# Patient Record
Sex: Female | Born: 1995 | Race: Black or African American | Hispanic: No | Marital: Married | Smoking: Former smoker
Health system: Southern US, Community
[De-identification: ages and names within clinical notes are randomized; demographics above are authoritative.]

## PROBLEM LIST (undated history)

## (undated) DIAGNOSIS — R519 Headache, unspecified: Secondary | ICD-10-CM

## (undated) DIAGNOSIS — Z789 Other specified health status: Secondary | ICD-10-CM

## (undated) HISTORY — PX: NO PAST SURGERIES: SHX2092

## (undated) HISTORY — DX: Other specified health status: Z78.9

---

## 2020-05-19 ENCOUNTER — Ambulatory Visit (INDEPENDENT_AMBULATORY_CARE_PROVIDER_SITE_OTHER): Payer: 59 | Admitting: Obstetrics and Gynecology

## 2020-05-19 ENCOUNTER — Encounter: Payer: Self-pay | Admitting: Obstetrics and Gynecology

## 2020-05-19 ENCOUNTER — Other Ambulatory Visit: Payer: Self-pay

## 2020-05-19 VITALS — BP 120/84 | HR 97 | Ht 66.0 in | Wt 141.0 lb

## 2020-05-19 DIAGNOSIS — N926 Irregular menstruation, unspecified: Secondary | ICD-10-CM | POA: Diagnosis not present

## 2020-05-19 NOTE — Progress Notes (Signed)
Gynecology H&P  PCP: System, Pcp Not In  Chief Complaint:  Chief Complaint  Patient presents with  . Advice Only    Infertility    History of Present Illness: Patient is a 24 y.o. G1P0010 presenting for evaluation of infertility. Patient and partner have been attempting unprotected intercourse and actively trying for conception for 1 years.  Pregnancies with current partner no  Sexual History Frequency: some impotence issues urology evaluation pending Dyspareunia: no Use of Lubricant: no Douching: no   Ovulatory Evaluation LMP: Patient's last menstrual period was 04/30/2020. Menarche:11 Interval irregular occasional skips a month Duration of flow: 305 days Heavy Menses: no Clots: no Intermenstrual Bleeding: no Dysmenorrhea: no  Molim[place] occasionally Ovulation Predictor Kits Positive  not previously checked  PCOS  Wt Change: no Hirsutism: no Acne: yes  Hyperprolactinemia Galactorrhea: no Headaches: yes Vision Changes:  no  Thyroid Temperature Intolerance: yes Constipation or Diarrhea: yes constipation Hair Thinning:  no Palpitation:  no  Prior treatments Meds: none Other Therapies: Not applicable  Premature Ovarian Failure Family history of autism, mental retardation, or fragile X: no Family history of premature ovarian failure or early menopause: no Prior radiation or chemotherapy exposoure:  no  Tubal Factor Previous abdominal or pelvic surgery: no Pelvic Pain:  no Endometriosis: no STD: no PID: no Laparoscopy: no Prior HSG: no  Female Factor Sired prior conception:  no Semen analysis: no  Review of Systems: 10 point review of systems negative unless otherwise noted in HPI  Past Medical History:  There are no problems to display for this patient.   Past Surgical History:  Past Surgical History:  Procedure Laterality Date  . NO PAST SURGERIES      Family History:  History reviewed. No pertinent family history. Thyroid Problems:  yes Birth Defects/Inherited diseases:no  Social History:  Social History   Socioeconomic History  . Marital status: Married    Spouse name: Not on file  . Number of children: Not on file  . Years of education: Not on file  . Highest education level: Not on file  Occupational History  . Not on file  Tobacco Use  . Smoking status: Current Every Day Smoker  . Smokeless tobacco: Never Used  Vaping Use  . Vaping Use: Never used  Substance and Sexual Activity  . Alcohol use: Not Currently  . Drug use: Yes  . Sexual activity: Yes    Partners: Male    Birth control/protection: None  Other Topics Concern  . Not on file  Social History Narrative  . Not on file   Social Determinants of Health   Financial Resource Strain:   . Difficulty of Paying Living Expenses:   Food Insecurity:   . Worried About Programme researcher, broadcasting/film/video in the Last Year:   . Barista in the Last Year:   Transportation Needs:   . Freight forwarder (Medical):   Marland Kitchen Lack of Transportation (Non-Medical):   Physical Activity:   . Days of Exercise per Week:   . Minutes of Exercise per Session:   Stress:   . Feeling of Stress :   Social Connections:   . Frequency of Communication with Friends and Family:   . Frequency of Social Gatherings with Friends and Family:   . Attends Religious Services:   . Active Member of Clubs or Organizations:   . Attends Banker Meetings:   Marland Kitchen Marital Status:   Intimate Partner Violence:   . Fear of Current or  Ex-Partner:   . Emotionally Abused:   Marland Kitchen Physically Abused:   . Sexually Abused:     Allergies:  Allergies  Allergen Reactions  . Latex     Medications: Prior to Admission medications   Not on File    Physical Exam Vitals: Blood pressure 120/84, pulse 97, height 5\' 6"  (1.676 m), weight 141 lb (64 kg), last menstrual period 04/30/2020.  General: NAD HEENT: normocephalic, anicteric Pulmonary: No increased work of breathing Extremities: no  edema, erythema, or tenderness Neurologic: Grossly intact Psychiatric: mood appropriate, affect full  * No order type specified *  Assessment: 24 y.o. G1P0010 presenting for initial infertility evaluatoin Plan: 1) We discussed the underlying etiologies which may be implicated in a couple experiencing difficulty conceiving.  The average couple will conceive within the span of 1 year with unprotected coitus, with a monthly fecundity rate of 20% or 1 in 5.  Even without further work up or intervention the patient and her partner may be successful in conceiving unassisted, although if an underlying etiology can be identified and addressed fecundity rate may improve.  The work up entails examining for ovulatory function, tubal patency, and ruling out female factor infertility.  These may be looked at concurrently or sequentially.  The downside of sequential work up is that this method may miss issues if more than one compartment is contributing.  She is aware that tubal factor or moderate to severe female factor infertility will require further consultation with a reproductive endocrinologist.  In the case of anovulation, use of Clomid (clomiphen citrate) or Femara (letrazole) were discussed with the understanding the the later is an off-label, but well supported use.  With either of these drugs the risk of multiples increases from the standard population rate of 2% to approximately 10%, with higher order multiples possible but unlikely.  Both drugs may require some time to titrate to the appropriate dosage to ensure consistent ovulation.  Cycles will be limited to 6 cycles on each drug secondary to decreasing rates of conception after 6 cycles.  In addition should patient be started on ovulation induction with Clomid she was advised to discontinue the drug for any vision changes as this is a rare but potentially permanent side-effect if medication is continued.  We discussed timing of intercourse as well as the use  of ovulation predictor kits identify the patient's fertile window each month.     2) Preconception counseling - immunization up to date.  The patient denies any family history of conditions which would warrant preconception genetic counseling or testing on her or her partner.  Instructed to start prenatal vitamins while trying to conceive.    3) Return will call patient with results.    24, MD, [facility], [facility] 05/19/2020, 3:15 PM

## 2020-05-22 LAB — TSH+PRL+FSH+TESTT+LH+DHEA S...
17-Hydroxyprogesterone: 248 ng/dL
Androstenedione: 152 ng/dL (ref 41–262)
DHEA-SO4: 167 ug/dL (ref 110.0–431.7)
FSH: 14 m[IU]/mL
LH: 80.7 m[IU]/mL
Prolactin: 22 ng/mL (ref 4.8–23.3)
TSH: 0.775 u[IU]/mL (ref 0.450–4.500)
Testosterone, Free: 1.8 pg/mL (ref 0.0–4.2)
Testosterone: 25 ng/dL (ref 13–71)

## 2020-05-22 LAB — PROGESTERONE: Progesterone: 0.7 ng/mL

## 2020-05-22 LAB — ESTRADIOL: Estradiol: 228 pg/mL

## 2020-09-24 ENCOUNTER — Encounter: Payer: Self-pay | Admitting: *Deleted

## 2020-09-24 ENCOUNTER — Emergency Department
Admission: EM | Admit: 2020-09-24 | Discharge: 2020-09-24 | Disposition: A | Discharge status: Home / Self Care | Payer: Self-pay | Attending: Emergency Medicine | Admitting: Emergency Medicine

## 2020-09-24 ENCOUNTER — Other Ambulatory Visit: Payer: Self-pay

## 2020-09-24 DIAGNOSIS — M545 Low back pain, unspecified: Secondary | ICD-10-CM | POA: Insufficient documentation

## 2020-09-24 DIAGNOSIS — F17200 Nicotine dependence, unspecified, uncomplicated: Secondary | ICD-10-CM | POA: Insufficient documentation

## 2020-09-24 DIAGNOSIS — Z91040 Latex allergy status: Secondary | ICD-10-CM | POA: Insufficient documentation

## 2020-09-24 DIAGNOSIS — M25552 Pain in left hip: Secondary | ICD-10-CM | POA: Insufficient documentation

## 2020-09-24 LAB — PREGNANCY, URINE: Preg Test, Ur: NEGATIVE

## 2020-09-24 LAB — URINALYSIS, COMPLETE (UACMP) WITH MICROSCOPIC
Bacteria, UA: NONE SEEN
Bilirubin Urine: NEGATIVE
Glucose, UA: NEGATIVE mg/dL
Hgb urine dipstick: NEGATIVE
Ketones, ur: 20 mg/dL — AB
Leukocytes,Ua: NEGATIVE
Nitrite: NEGATIVE
Protein, ur: 100 mg/dL — AB
Specific Gravity, Urine: 1.018 (ref 1.005–1.030)
pH: 6 (ref 5.0–8.0)

## 2020-09-24 MED ORDER — CYCLOBENZAPRINE HCL 5 MG PO TABS
5.0000 mg | ORAL_TABLET | Freq: Three times a day (TID) | ORAL | 0 refills | Status: DC | PRN
Start: 2020-09-24 — End: 2020-11-26

## 2020-09-24 MED ORDER — CYCLOBENZAPRINE HCL 10 MG PO TABS
10.0000 mg | ORAL_TABLET | Freq: Once | ORAL | Status: AC
  Administered 2020-09-24: 10 mg via ORAL
  Filled 2020-09-24: qty 1

## 2020-09-24 MED ORDER — KETOROLAC TROMETHAMINE 10 MG PO TABS
10.0000 mg | ORAL_TABLET | Freq: Three times a day (TID) | ORAL | 0 refills | Status: DC
Start: 2020-09-24 — End: 2020-11-26

## 2020-09-24 MED ORDER — KETOROLAC TROMETHAMINE 30 MG/ML IJ SOLN
30.0000 mg | Freq: Once | INTRAMUSCULAR | Status: AC
  Administered 2020-09-24: 30 mg via INTRAMUSCULAR
  Filled 2020-09-24: qty 1

## 2020-09-24 NOTE — ED Notes (Addendum)
Pt also stating she feels lately that she is urinating more frequently and having to think about emptying her bladder. No dysuria reported. Pt unable to urinate at this time but given a sample cup and water.

## 2020-09-24 NOTE — ED Triage Notes (Signed)
Pt to ED reporting left sided hip pain radiating to her leg and up her back. No recent injury. Pt works with [employer] and reports having to move frequently at work.

## 2020-09-24 NOTE — ED Notes (Signed)
Pt states that last week she began to have lower back and left hip pain. Pt reports pain worsened yesterday and became constant. Some relief with laying down/feet elevated.   No specific instances of strain/injury noted. Not currently taking any pain medication. [employer] delivery driver  Pt reports urinary frequency and urgency, denies pain. "even drinking just a little bit, I feel like I have to pee immediately. And it's never a lot." Previously had UTI's

## 2020-09-24 NOTE — Discharge Instructions (Signed)
Take the prescription medications as provided.  Follow-up with primary provider for ongoing symptoms.  Return to the [facility] urgent care if symptoms persist.

## 2020-09-25 NOTE — ED Provider Notes (Signed)
[facility] Emergency Department Provider Note ____________________________________________  Time seen: 2048  I have reviewed the triage vital signs and the nursing notes.  HISTORY  Chief Complaint  Hip Pain  HPI Kendra Ward is a 24 y.o. female presents with self to the ED for evaluation of left hip and low back pain without recent injury.  Patient reports symptom has been present for about a week.  She has been taking Excedrin with some intermittent benefit.  She denies any preceding injury, trauma, or fall patient denies any bladder or bowel incontinence, foot drop, or saddle anesthesia.  She denies any history of chronic ongoing back or hip problems.  She denies any dysuria, hematuria, or urinary retention.   History reviewed. No pertinent past medical history.  There are no problems to display for this patient.   Past Surgical History:  Procedure Laterality Date  . NO PAST SURGERIES      Prior to Admission medications   Medication Sig Start Date End Date Taking? Authorizing Provider  cyclobenzaprine (FLEXERIL) 5 MG tablet Take 1 tablet (5 mg total) by mouth 3 (three) times daily as needed. 09/24/20   Ziyonna Christner, Charlesetta Ivory, PA-C  ketorolac (TORADOL) 10 MG tablet Take 1 tablet (10 mg total) by mouth every 8 (eight) hours. 09/24/20   Raniyah Curenton, Charlesetta Ivory, PA-C    Allergies Latex  History reviewed. No pertinent family history.  Social History Social History   Tobacco Use  . Smoking status: Current Every Day Smoker  . Smokeless tobacco: Never Used  Vaping Use  . Vaping Use: Never used  Substance Use Topics  . Alcohol use: Not Currently  . Drug use: Yes    Types: Marijuana    Review of Systems  Constitutional: Negative for fever. Cardiovascular: Negative for chest pain. Respiratory: Negative for shortness of breath. Gastrointestinal: Negative for abdominal pain, vomiting and diarrhea. Genitourinary: Negative for  dysuria. Musculoskeletal: Positive for low back and left hip pain. Skin: Negative for rash. Neurological: Negative for headaches, focal weakness or numbness. ____________________________________________  PHYSICAL EXAM:  VITAL SIGNS: ED Triage Vitals  Enc Vitals Group     BP 09/24/20 1958 134/74     Pulse Rate 09/24/20 1958 78     Resp 09/24/20 1958 16     Temp 09/24/20 1958 98.2 F (36.8 C)     Temp Source 09/24/20 1958 Oral     SpO2 09/24/20 1958 98 %     Weight --      Height --      Head Circumference --      Peak Flow --      Pain Score 09/24/20 1959 8     Pain Loc --      Pain Edu? --      Excl. in GC? --     Constitutional: Alert and oriented. Well appearing and in no distress. Head: Normocephalic and atraumatic. Eyes: Conjunctivae are normal. Normal extraocular movements Cardiovascular: Normal rate, regular rhythm. Normal distal pulses. Respiratory: Normal respiratory effort. No wheezes/rales/rhonchi. Gastrointestinal: Soft and nontender. No distention. No CVA tenderness Musculoskeletal: Normal spinal alignment without midline tenderness, spasm, vomiting, or step-off.  Normal hip flexion extension range on exam.  Mild tender to palpation over the lateral hip pointer.  Nontender with normal range of motion in all extremities.  Neurologic: Cranial nerves II to XII grossly intact.  Normal LE DTRs bilaterally.  Negative supine straight leg raise.  Normal gait without ataxia. Normal speech and language. No gross focal  neurologic deficits are appreciated. Skin:  Skin is warm, dry and intact. No rash noted. Psychiatric: Mood and affect are normal. Patient exhibits appropriate insight and judgment. ____________________________________________   LABS (pertinent positives/negatives) Labs Reviewed  URINALYSIS, COMPLETE (UACMP) WITH MICROSCOPIC - Abnormal; Notable for the following components:      Result Value   Color, Urine YELLOW (*)    APPearance CLEAR (*)    Ketones, ur  20 (*)    Protein, ur 100 (*)    All other components within normal limits  PREGNANCY, URINE  ___________________________________________  PROCEDURES  Toradol 30 mg IM Flexeril 10 mg PO  Procedures ____________________________________________  INITIAL IMPRESSION / ASSESSMENT AND PLAN / ED COURSE  DDX: lumbago, trochanteric bursitis, ITB syndrome, UTI/pyelonephritis  Patient with ED evaluation of left-sided hip pain and some low back pain.  Patient exam is without any signs of acute neuromuscular deficit or any red flags on exam.  She was treated for symptoms that likely indicate musculoskeletal strain or tendinitis.  Prescription for ketorolac and cyclobenzaprine provided for benefit.  She is referred to her primary provider for ongoing symptom management.  Kendra Ward was evaluated in Emergency Department on 09/25/2020 for the symptoms described in the history of present illness. She was evaluated in the context of the global COVID-19 pandemic, which necessitated consideration that the patient might be at risk for infection with the SARS-CoV-2 virus that causes COVID-19. Institutional protocols and algorithms that pertain to the evaluation of patients at risk for COVID-19 are in a state of rapid change based on information released by regulatory bodies including the CDC and federal and state organizations. These policies and algorithms were followed during the patient's care in the ED. ___________________________________________  FINAL CLINICAL IMPRESSION(S) / ED DIAGNOSES  Final diagnoses:  Acute left-sided low back pain without sciatica  Acute pain of left hip      Karmen Stabs, Charlesetta Ivory, PA-C 09/25/20 0027    Arnaldo Natal, MD 09/29/20 1221

## 2020-09-30 ENCOUNTER — Telehealth: Payer: Self-pay | Admitting: Obstetrics and Gynecology

## 2020-09-30 NOTE — Telephone Encounter (Signed)
Patient is calling requesting  Call back with her blood work results from her apt on 05/19/20. Please Advise.

## 2020-10-01 ENCOUNTER — Other Ambulatory Visit: Payer: Self-pay | Admitting: Obstetrics and Gynecology

## 2020-10-01 DIAGNOSIS — N970 Female infertility associated with anovulation: Secondary | ICD-10-CM

## 2020-10-01 MED ORDER — LETROZOLE 2.5 MG PO TABS: 2.5000 mg | ORAL_TABLET | Freq: Every day | ORAL | 0 refills | Status: AC

## 2020-10-01 NOTE — Telephone Encounter (Signed)
Day 21 progesterone on 10/22/2019 order should be in

## 2020-10-01 NOTE — Progress Notes (Signed)
Cycle I letrozole 2.5mg  LMP 10/01/2020 and day 21 progesterone 10/22/2019

## 2020-10-06 NOTE — Telephone Encounter (Signed)
Patient is scheduled for 10/21/20 at 10 am for labs

## 2020-10-11 DIAGNOSIS — N83209 Unspecified ovarian cyst, unspecified side: Secondary | ICD-10-CM

## 2020-10-11 HISTORY — DX: Unspecified ovarian cyst, unspecified side: N83.209

## 2020-10-21 ENCOUNTER — Other Ambulatory Visit: Payer: Self-pay

## 2020-10-22 LAB — PROGESTERONE: Progesterone: 25.3 ng/mL

## 2020-10-31 ENCOUNTER — Other Ambulatory Visit: Payer: Self-pay | Admitting: Obstetrics and Gynecology

## 2020-10-31 MED ORDER — LETROZOLE 2.5 MG PO TABS: 2.5000 mg | ORAL_TABLET | Freq: Every day | ORAL | 0 refills | Status: AC

## 2020-10-31 NOTE — Progress Notes (Signed)
Letrozole cycle II LMP 10/30/2019

## 2020-11-25 ENCOUNTER — Encounter: Payer: Self-pay | Admitting: Emergency Medicine

## 2020-11-25 ENCOUNTER — Emergency Department: Payer: Medicaid Other

## 2020-11-25 ENCOUNTER — Other Ambulatory Visit: Payer: Self-pay

## 2020-11-25 ENCOUNTER — Observation Stay
Admission: EM | Admit: 2020-11-25 | Discharge: 2020-11-26 | Disposition: A | Discharge status: Home / Self Care | Payer: Medicaid Other | Attending: Emergency Medicine | Admitting: Emergency Medicine

## 2020-11-25 DIAGNOSIS — Z91040 Latex allergy status: Secondary | ICD-10-CM | POA: Diagnosis not present

## 2020-11-25 DIAGNOSIS — R1032 Left lower quadrant pain: Secondary | ICD-10-CM

## 2020-11-25 DIAGNOSIS — O3481 Maternal care for other abnormalities of pelvic organs, first trimester: Principal | ICD-10-CM | POA: Insufficient documentation

## 2020-11-25 DIAGNOSIS — N83202 Unspecified ovarian cyst, left side: Secondary | ICD-10-CM | POA: Insufficient documentation

## 2020-11-25 DIAGNOSIS — Z20822 Contact with and (suspected) exposure to covid-19: Secondary | ICD-10-CM | POA: Insufficient documentation

## 2020-11-25 DIAGNOSIS — O99331 Smoking (tobacco) complicating pregnancy, first trimester: Secondary | ICD-10-CM | POA: Diagnosis not present

## 2020-11-25 DIAGNOSIS — F17200 Nicotine dependence, unspecified, uncomplicated: Secondary | ICD-10-CM | POA: Insufficient documentation

## 2020-11-25 DIAGNOSIS — R1030 Lower abdominal pain, unspecified: Secondary | ICD-10-CM

## 2020-11-25 DIAGNOSIS — O009 Unspecified ectopic pregnancy without intrauterine pregnancy: Secondary | ICD-10-CM

## 2020-11-25 DIAGNOSIS — Z3A01 Less than 8 weeks gestation of pregnancy: Secondary | ICD-10-CM | POA: Diagnosis not present

## 2020-11-25 DIAGNOSIS — O26891 Other specified pregnancy related conditions, first trimester: Secondary | ICD-10-CM | POA: Diagnosis present

## 2020-11-25 LAB — WET PREP, GENITAL
Sperm: NONE SEEN
Trich, Wet Prep: NONE SEEN
Yeast Wet Prep HPF POC: NONE SEEN

## 2020-11-25 LAB — COMPREHENSIVE METABOLIC PANEL
ALT: 15 U/L (ref 0–44)
AST: 21 U/L (ref 15–41)
Albumin: 4.4 g/dL (ref 3.5–5.0)
Alkaline Phosphatase: 40 U/L (ref 38–126)
Anion gap: 7 (ref 5–15)
BUN: 11 mg/dL (ref 6–20)
CO2: 23 mmol/L (ref 22–32)
Calcium: 9.2 mg/dL (ref 8.9–10.3)
Chloride: 105 mmol/L (ref 98–111)
Creatinine, Ser: 0.65 mg/dL (ref 0.44–1.00)
GFR, Estimated: >60 mL/min (ref >60)
Glucose, Bld: 105 mg/dL — ABNORMAL HIGH (ref 70–99)
Potassium: 3.3 mmol/L — ABNORMAL LOW (ref 3.5–5.1)
Sodium: 135 mmol/L (ref 135–145)
Total Bilirubin: 0.8 mg/dL (ref 0.3–1.2)
Total Protein: 7.9 g/dL (ref 6.5–8.1)

## 2020-11-25 LAB — URINALYSIS, COMPLETE (UACMP) WITH MICROSCOPIC
Bacteria, UA: NONE SEEN
Bilirubin Urine: NEGATIVE
Glucose, UA: NEGATIVE mg/dL
Hgb urine dipstick: NEGATIVE
Ketones, ur: 5 mg/dL — AB
Leukocytes,Ua: NEGATIVE
Nitrite: NEGATIVE
Protein, ur: 30 mg/dL — AB
Specific Gravity, Urine: 1.027 (ref 1.005–1.030)
pH: 5 (ref 5.0–8.0)

## 2020-11-25 LAB — CBC
HCT: 34.6 % — ABNORMAL LOW (ref 36.0–46.0)
Hemoglobin: 12.3 g/dL (ref 12.0–15.0)
MCH: 31.4 pg (ref 26.0–34.0)
MCHC: 35.5 g/dL (ref 30.0–36.0)
MCV: 88.3 fL (ref 80.0–100.0)
Platelets: 313 10*3/uL (ref 150–400)
RBC: 3.92 MIL/uL (ref 3.87–5.11)
RDW: 12.1 % (ref 11.5–15.5)
WBC: 7.7 10*3/uL (ref 4.0–10.5)
nRBC: 0 % (ref 0.0–0.2)

## 2020-11-25 LAB — LIPASE, BLOOD: Lipase: 23 U/L (ref 11–51)

## 2020-11-25 LAB — HCG, QUANTITATIVE, PREGNANCY: hCG, Beta Chain, Quant, S: 479 m[IU]/mL — ABNORMAL HIGH (ref <5)

## 2020-11-25 MED ORDER — METRONIDAZOLE 500 MG PO TABS: 500.0000 mg | ORAL_TABLET | Freq: Two times a day (BID) | ORAL | 0 refills | Status: AC

## 2020-11-25 MED ORDER — POTASSIUM CHLORIDE CRYS ER 20 MEQ PO TBCR
20.0000 meq | EXTENDED_RELEASE_TABLET | Freq: Once | ORAL | Status: AC
  Administered 2020-11-25: 20 meq via ORAL
  Filled 2020-11-25: qty 1

## 2020-11-25 MED ORDER — ONDANSETRON 4 MG PO TBDP
4.0000 mg | ORAL_TABLET | Freq: Once | ORAL | Status: AC
  Administered 2020-11-25: 4 mg via ORAL
  Filled 2020-11-25: qty 1

## 2020-11-25 MED ORDER — ONDANSETRON 4 MG PO TBDP: 4.0000 mg | ORAL_TABLET | Freq: Three times a day (TID) | ORAL | 0 refills | Status: DC | PRN

## 2020-11-25 MED ORDER — METRONIDAZOLE 500 MG PO TABS: 500.0000 mg | ORAL_TABLET | Freq: Once | ORAL | Status: DC

## 2020-11-25 MED ORDER — SODIUM CHLORIDE 0.9 % IV SOLN: INTRAVENOUS | Status: DC

## 2020-11-25 MED ORDER — ACETAMINOPHEN 500 MG PO TABS
1000.0000 mg | ORAL_TABLET | Freq: Once | ORAL | Status: AC
  Administered 2020-11-25: 1000 mg via ORAL
  Filled 2020-11-25: qty 2

## 2020-11-25 NOTE — ED Provider Notes (Signed)
[facility] Emergency Department Provider Note   ____________________________________________   Event Date/Time   First MD Initiated Contact with Patient 11/25/20 2117     (approximate)  I have reviewed the triage vital signs and the nursing notes.   HISTORY  Chief Complaint Abdominal Pain    HPI Kendra Ward is a 25 y.o. female with no significant past medical history who presents to the ED complaining of abdominal pain.  Patient reports she has been having intermittent crampy lower abdominal pain for the past 3 days.  There is been associated with nausea and a couple episodes of vomiting, but she denies any diarrhea.  She took a pregnancy test earlier today which was positive and decided to seek further evaluation in the ED.  She denies any fevers, dysuria, hematuria, vaginal bleeding, or vaginal discharge.  She has had 1 miscarriage previously but does not have any children.  She has not taken anything for her pain at home.        History reviewed. No pertinent past medical history.  There are no problems to display for this patient.   Past Surgical History:  Procedure Laterality Date  . NO PAST SURGERIES      Prior to Admission medications   Medication Sig Start Date End Date Taking? Authorizing Provider  cyclobenzaprine (FLEXERIL) 5 MG tablet Take 1 tablet (5 mg total) by mouth 3 (three) times daily as needed. 09/24/20   Menshew, Charlesetta Ivory, PA-C  ketorolac (TORADOL) 10 MG tablet Take 1 tablet (10 mg total) by mouth every 8 (eight) hours. 09/24/20   Menshew, Charlesetta Ivory, PA-C    Allergies Latex  History reviewed. No pertinent family history.  Social History Social History   Tobacco Use  . Smoking status: Current Every Day Smoker  . Smokeless tobacco: Never Used  Vaping Use  . Vaping Use: Never used  Substance Use Topics  . Alcohol use: Not Currently  . Drug use: Yes    Types: Marijuana    Review of  Systems  Constitutional: No fever/chills Eyes: No visual changes. ENT: No sore throat. Cardiovascular: Denies chest pain. Respiratory: Denies shortness of breath. Gastrointestinal: Positive for abdominal pain, nausea, and vomiting.  No diarrhea.  No constipation. Genitourinary: Negative for dysuria. Musculoskeletal: Negative for back pain. Skin: Negative for rash. Neurological: Negative for headaches, focal weakness or numbness.  ____________________________________________   PHYSICAL EXAM:  VITAL SIGNS: ED Triage Vitals [11/25/20 2014]  Enc Vitals Group     BP 126/77     Pulse Rate 91     Resp 17     Temp 98.5 F (36.9 C)     Temp Source Oral     SpO2 100 %     Weight 145 lb (65.8 kg)     Height      Head Circumference      Peak Flow      Pain Score      Pain Loc      Pain Edu?      Excl. in GC?     Constitutional: Alert and oriented. Eyes: Conjunctivae are normal. Head: Atraumatic. Nose: No congestion/rhinnorhea. Mouth/Throat: Mucous membranes are moist. Neck: Normal ROM Cardiovascular: Normal rate, regular rhythm. Grossly normal heart sounds. Respiratory: Normal respiratory effort.  No retractions. Lungs CTAB. Gastrointestinal: Soft and nontender. No distention. Genitourinary: Cervical os closed, no bleeding noted, no cervical or adnexal tenderness. Musculoskeletal: No lower extremity tenderness nor edema. Neurologic:  Normal speech and language. No gross  focal neurologic deficits are appreciated. Skin:  Skin is warm, dry and intact. No rash noted. Psychiatric: Mood and affect are normal. Speech and behavior are normal.  ____________________________________________   LABS (all labs ordered are listed, but only abnormal results are displayed)  Labs Reviewed  COMPREHENSIVE METABOLIC PANEL - Abnormal; Notable for the following components:      Result Value   Potassium 3.3 (*)    Glucose, Bld 105 (*)    All other components within normal limits  CBC -  Abnormal; Notable for the following components:   HCT 34.6 (*)    All other components within normal limits  URINALYSIS, COMPLETE (UACMP) WITH MICROSCOPIC - Abnormal; Notable for the following components:   Color, Urine YELLOW (*)    APPearance HAZY (*)    Ketones, ur 5 (*)    Protein, ur 30 (*)    All other components within normal limits  HCG, QUANTITATIVE, PREGNANCY - Abnormal; Notable for the following components:   hCG, Beta Chain, Quant, S 479 (*)    All other components within normal limits  WET PREP, GENITAL  CHLAMYDIA/NGC RT PCR ([facility] ONLY)  LIPASE, BLOOD    PROCEDURES  Procedure(s) performed (including Critical Care):  Procedures   ____________________________________________   INITIAL IMPRESSION / ASSESSMENT AND PLAN / ED COURSE       25 year old female with no significant past medical history who presents to the ED complaining of abdominal pain for about the past 3 days with nausea and occasional vomiting.  She has no focal tenderness on exam and pelvic exam is reassuring with no cervical motion adnexal tenderness.  Lab work is unremarkable, beta hCG is quite low around 450.  We will further assess with ultrasound for location of pregnancy, but it appears to be very early in patient's pregnancy given her LMP of about 4 weeks ago.  UA shows no evidence of infection and wet prep is pending.  Patient turned over to oncoming provider pending results of ultrasound.  If ultrasound is unable to locate pregnancy, patient be appropriate for discharge home with close OB/GYN follow-up.      ____________________________________________   FINAL CLINICAL IMPRESSION(S) / ED DIAGNOSES  Final diagnoses:  Abdominal pain during pregnancy in first trimester     ED Discharge Orders    None       Note:  This document was prepared using Dragon voice recognition software and may include unintentional dictation errors.   Chesley Noon, MD 11/25/20 2312

## 2020-11-25 NOTE — ED Provider Notes (Addendum)
11:00 PM  Assumed care.  Patient is a 25 y.o. F who presents to ED with 2 days pelvic pain, N/V.  Is pregnant with HCG less than 500. [place] pending.   Will follow up with [facility].  No bleeding, pelvic unremarkable.  Wet prep pending.  11:37 PM  D/w radiology.  Patient appears to have a ruptured left-sided ectopic pregnancy.  LMP Jan 19th.  She appears comfortable at this time and denies any pain.  Hemodynamically stable.  Hemoglobin normal.  No vaginal bleeding.  Discussed with Dr. Tiburcio Pea with [facility].  He will see patient in the ED.  We will keep patient n.p.o. and start IV fluids and obtain IV access.  Patient and partner have been updated with these results.  She reports that she is on letrozole currently.  She has been pregnant once before with history of spontaneous miscarriage.  No h/o ectopic previously.  12:00 AM  Pt has been seen by Dr. Tiburcio Pea.  He will plan to take patient to the operating room.  States given her low hCG and recent menstrual cycle January 19 but this also could be a ruptured cyst but will perform laparoscopy for further evaluation.  Appreciate OB/GYN help.  Patient continues to be hemodynamically stable.  I reviewed all nursing notes and pertinent previous records as available.  I have reviewed and interpreted any EKGs, lab and urine results, imaging (as available).    CRITICAL CARE Performed by: Rochele Raring   Total critical care time: 45 minutes  Critical care time was exclusive of separately billable procedures and treating other patients.  Critical care was necessary to treat or prevent imminent or life-threatening deterioration.  Critical care was time spent personally by me on the following activities: development of treatment plan with patient and/or surrogate as well as nursing, discussions with consultants, evaluation of patient's response to treatment, examination of patient, obtaining history from patient or surrogate, ordering and performing treatments  and interventions, ordering and review of laboratory studies, ordering and review of radiographic studies, pulse oximetry and re-evaluation of patient's condition.       Clyda Smyth, Layla Maw, DO 11/26/20 [phone]

## 2020-11-25 NOTE — ED Triage Notes (Signed)
Pt c/o lower abdominal pain x3 days after starting a new prenatal vitamin. Pt took pregnancy test this AM with a positive reading. Pt denies vaginal bleeding. Pt c/o N/V x2 days.

## 2020-11-25 NOTE — Discharge Instructions (Addendum)
Diagnostic Laparoscopy, Care After The following information offers guidance on how to care for yourself after your procedure. Your health care provider may also give you more specific instructions. If you have problems or questions, contact your health care provider. What can I expect after the procedure? After the procedure, it is common to have:  Mild discomfort in the abdomen.  Sore throat. Women who have laparoscopy with a pelvic examination may have mild cramping and fluid coming from the vagina for a few days after the procedure. Follow these instructions at home: Medicines  Take over-the-counter and prescription medicines only as told by your health care provider.  If you were prescribed an antibiotic medicine, take it as told by your health care provider. Do not stop taking the antibiotic even if you start to feel better.  Ask your health care provider if the medicine prescribed to you: ? Requires you to avoid driving or using machinery. ? Can cause constipation. You may need to take these actions to prevent or treat constipation:  Drink enough fluid to keep your urine pale yellow.  Take over-the-counter or prescription medicines.  Eat foods that are high in fiber, such as beans, whole grains, and fresh fruits and vegetables.  Limit foods that are high in fat and processed sugars, such as fried or sweet foods. Incision care  Follow instructions from your health care provider about how to take care of your incisions. Make sure you: ? Wash your hands with soap and water for at least 20 seconds before and after you change your bandage (dressing). If soap and water are not available, use hand sanitizer. ? Change your dressing as told by your health care provider. ? Leave stitches (sutures), skin glue, or surgical tape in place. These skin closures may need to stay in place for 2 weeks or longer. If surgical tape edges start to loosen and curl up, you may trim the loose edges. Do  not remove the surgical tape completely unless your health care provider tells you to do that.  Check your incision areas every day for signs of infection. Check for: ? Redness, swelling, or pain. ? Fluid or blood. ? Warmth. ? Pus or a bad smell.   Activity  Return to your normal activities as told by your health care provider. Ask your health care provider what activities are safe for you.  Do not lift anything that is heavier than 10 lb (4.5 kg), or the limit that you are told, until your health care provider says that it is safe.  Avoid sitting for a long time without moving. Get up to take short walks every 1-2 hours. This is important to improve blood flow and breathing. Ask for help if you feel weak or unsteady. General instructions  Do not use any products that contain nicotine or tobacco. These products include cigarettes, chewing tobacco, and vaping devices, such as e-cigarettes. If you need help quitting, ask your health care provider.  If you were given a sedative during the procedure, it can affect you for several hours. Do not drive or operate machinery until your health care provider says that it is safe.  Do not take baths, swim, or use a hot tub until your health care provider approves. Ask your health care provider if you may take showers. You may only be allowed to take sponge baths.  Keep all follow-up visits. This is important. Contact a health care provider if:  You develop shoulder pain.  You feel light-headed or   faint.  You are unable to pass gas or have a bowel movement.  You feel nauseous or you vomit.  You develop a rash.  You have any of these signs of infection: ? Redness, swelling, or pain around an incision. ? Fluid or blood coming from an incision. ? Warmth coming from an incision. ? Pus or a bad smell coming from an incision. ? A fever or chills. Get help right away if:  You have severe pain.  You have vomiting that does not go away.  You  have heavy bleeding from the vagina.  Any incision opens up.  You have trouble breathing.  You have chest pain. These symptoms may represent a serious problem that is an emergency. Do not wait to see if the symptoms will go away. Get medical help right away. Call your local emergency services (911 in the [place] Do not drive yourself to the hospital. Summary  After the procedure, it is common to have mild discomfort in the abdomen and a sore throat.  Check your incision areas every day for signs of infection.  Return to your normal activities as told by your health care provider. Ask your health care provider what activities are safe for you. This information is not intended to replace advice given to you by your health care provider. Make sure you discuss any questions you have with your health care provider. Document Revised: 05/23/2020 Document Reviewed: 05/23/2020 Elsevier Patient Education  2021 [employer]  

## 2020-11-25 NOTE — ED Notes (Signed)
Chaperoned pelvic exam performed by Dr. Larinda Buttery. Swabs collected and sent to lab.

## 2020-11-26 ENCOUNTER — Encounter
Admission: EM | Disposition: A | Discharge status: Home / Self Care | Payer: Self-pay | Source: Home / Self Care | Attending: Emergency Medicine

## 2020-11-26 ENCOUNTER — Other Ambulatory Visit: Payer: Self-pay | Admitting: Obstetrics & Gynecology

## 2020-11-26 ENCOUNTER — Encounter: Payer: Self-pay | Admitting: Anesthesiology

## 2020-11-26 ENCOUNTER — Emergency Department: Payer: Medicaid Other | Admitting: Anesthesiology

## 2020-11-26 DIAGNOSIS — R1032 Left lower quadrant pain: Secondary | ICD-10-CM

## 2020-11-26 DIAGNOSIS — N83202 Unspecified ovarian cyst, left side: Secondary | ICD-10-CM | POA: Diagnosis not present

## 2020-11-26 DIAGNOSIS — R1030 Lower abdominal pain, unspecified: Secondary | ICD-10-CM

## 2020-11-26 HISTORY — PX: DIAGNOSTIC LAPAROSCOPY WITH REMOVAL OF ECTOPIC PREGNANCY: SHX6449

## 2020-11-26 LAB — TYPE AND SCREEN
ABO/RH(D): O POS
Antibody Screen: NEGATIVE

## 2020-11-26 LAB — RESP PANEL BY RT-PCR (FLU A&B, COVID) ARPGX2
Influenza A by PCR: NEGATIVE
Influenza B by PCR: NEGATIVE
SARS Coronavirus 2 by RT PCR: NEGATIVE

## 2020-11-26 LAB — CHLAMYDIA/NGC RT PCR (ARMC ONLY)
Chlamydia Tr: NOT DETECTED
N gonorrhoeae: NOT DETECTED

## 2020-11-26 LAB — PROTIME-INR
INR: 1 (ref 0.8–1.2)
Prothrombin Time: 13.1 seconds (ref 11.4–15.2)

## 2020-11-26 SURGERY — LAPAROSCOPY, WITH ECTOPIC PREGNANCY SURGICAL TREATMENT
Anesthesia: General | Site: Abdomen

## 2020-11-26 MED ORDER — PROPOFOL 10 MG/ML IV BOLUS
INTRAVENOUS | Status: DC | PRN
  Administered 2020-11-26: 150 mg via INTRAVENOUS

## 2020-11-26 MED ORDER — FENTANYL CITRATE (PF) 100 MCG/2ML IJ SOLN
INTRAMUSCULAR | Status: DC | PRN
  Administered 2020-11-26 (×2): 50 ug via INTRAVENOUS

## 2020-11-26 MED ORDER — ONDANSETRON HCL 4 MG/2ML IJ SOLN: 4.0000 mg | Freq: Four times a day (QID) | INTRAMUSCULAR | Status: DC | PRN

## 2020-11-26 MED ORDER — MORPHINE SULFATE (PF) 2 MG/ML IV SOLN: 1.0000 mg | INTRAVENOUS | Status: DC | PRN

## 2020-11-26 MED ORDER — ROCURONIUM BROMIDE 10 MG/ML (PF) SYRINGE
PREFILLED_SYRINGE | INTRAVENOUS | Status: AC
  Filled 2020-11-26: qty 10

## 2020-11-26 MED ORDER — SEVOFLURANE IN SOLN
RESPIRATORY_TRACT | Status: AC
  Filled 2020-11-26: qty 250

## 2020-11-26 MED ORDER — SUCCINYLCHOLINE CHLORIDE 200 MG/10ML IV SOSY
PREFILLED_SYRINGE | INTRAVENOUS | Status: AC
  Filled 2020-11-26: qty 10

## 2020-11-26 MED ORDER — ONDANSETRON HCL 4 MG PO TABS: 4.0000 mg | ORAL_TABLET | Freq: Four times a day (QID) | ORAL | Status: DC | PRN

## 2020-11-26 MED ORDER — DEXAMETHASONE SODIUM PHOSPHATE 10 MG/ML IJ SOLN
INTRAMUSCULAR | Status: DC | PRN
  Administered 2020-11-26: 10 mg via INTRAVENOUS

## 2020-11-26 MED ORDER — ROCURONIUM BROMIDE 100 MG/10ML IV SOLN
INTRAVENOUS | Status: DC | PRN
  Administered 2020-11-26: 30 mg via INTRAVENOUS

## 2020-11-26 MED ORDER — ACETAMINOPHEN 325 MG PO TABS
650.0000 mg | ORAL_TABLET | ORAL | Status: DC | PRN
  Administered 2020-11-26: 650 mg via ORAL
  Filled 2020-11-26: qty 2

## 2020-11-26 MED ORDER — LIDOCAINE HCL (PF) 2 % IJ SOLN
INTRAMUSCULAR | Status: AC
  Filled 2020-11-26: qty 5

## 2020-11-26 MED ORDER — SUCCINYLCHOLINE CHLORIDE 20 MG/ML IJ SOLN
INTRAMUSCULAR | Status: DC | PRN
  Administered 2020-11-26: 80 mg via INTRAVENOUS

## 2020-11-26 MED ORDER — KETOROLAC TROMETHAMINE 30 MG/ML IJ SOLN
INTRAMUSCULAR | Status: AC
  Filled 2020-11-26: qty 1

## 2020-11-26 MED ORDER — DEXAMETHASONE SODIUM PHOSPHATE 10 MG/ML IJ SOLN
INTRAMUSCULAR | Status: AC
  Filled 2020-11-26: qty 1

## 2020-11-26 MED ORDER — SIMETHICONE 80 MG PO CHEW: 80.0000 mg | CHEWABLE_TABLET | Freq: Four times a day (QID) | ORAL | Status: DC | PRN

## 2020-11-26 MED ORDER — PROPOFOL 10 MG/ML IV BOLUS
INTRAVENOUS | Status: AC
  Filled 2020-11-26: qty 20

## 2020-11-26 MED ORDER — BUPIVACAINE HCL (PF) 0.5 % IJ SOLN
INTRAMUSCULAR | Status: DC | PRN
  Administered 2020-11-26: 5 mL

## 2020-11-26 MED ORDER — LACTATED RINGERS IV SOLN: INTRAVENOUS | Status: DC | PRN

## 2020-11-26 MED ORDER — LACTATED RINGERS IV SOLN: INTRAVENOUS | Status: DC

## 2020-11-26 MED ORDER — ONDANSETRON HCL 4 MG/2ML IJ SOLN
INTRAMUSCULAR | Status: AC
  Filled 2020-11-26: qty 2

## 2020-11-26 MED ORDER — MIDAZOLAM HCL 2 MG/2ML IJ SOLN
INTRAMUSCULAR | Status: DC | PRN
  Administered 2020-11-26: 2 mg via INTRAVENOUS

## 2020-11-26 MED ORDER — MIDAZOLAM HCL 2 MG/2ML IJ SOLN
INTRAMUSCULAR | Status: AC
  Filled 2020-11-26: qty 2

## 2020-11-26 MED ORDER — KETOROLAC TROMETHAMINE 30 MG/ML IJ SOLN
30.0000 mg | Freq: Four times a day (QID) | INTRAMUSCULAR | Status: DC
  Administered 2020-11-26: 30 mg via INTRAVENOUS

## 2020-11-26 MED ORDER — OXYCODONE-ACETAMINOPHEN 5-325 MG PO TABS: 1.0000 | ORAL_TABLET | ORAL | Status: DC | PRN

## 2020-11-26 MED ORDER — LIDOCAINE HCL (CARDIAC) PF 100 MG/5ML IV SOSY
PREFILLED_SYRINGE | INTRAVENOUS | Status: DC | PRN
  Administered 2020-11-26: 80 mg via INTRAVENOUS

## 2020-11-26 MED ORDER — SUGAMMADEX SODIUM 200 MG/2ML IV SOLN
INTRAVENOUS | Status: DC | PRN
  Administered 2020-11-26: 150 mg via INTRAVENOUS

## 2020-11-26 MED ORDER — ONDANSETRON HCL 4 MG/2ML IJ SOLN
INTRAMUSCULAR | Status: DC | PRN
  Administered 2020-11-26: 4 mg via INTRAVENOUS

## 2020-11-26 MED ORDER — FENTANYL CITRATE (PF) 100 MCG/2ML IJ SOLN
25.0000 ug | INTRAMUSCULAR | Status: DC | PRN
Start: 2020-11-26 — End: 2020-11-26

## 2020-11-26 MED ORDER — FENTANYL CITRATE (PF) 100 MCG/2ML IJ SOLN
INTRAMUSCULAR | Status: AC
  Filled 2020-11-26: qty 2

## 2020-11-26 MED ORDER — BUPIVACAINE HCL (PF) 0.5 % IJ SOLN
INTRAMUSCULAR | Status: AC
  Filled 2020-11-26: qty 30

## 2020-11-26 MED ORDER — INFLUENZA VAC SPLIT QUAD 0.5 ML IM SUSY: 0.5000 mL | PREFILLED_SYRINGE | INTRAMUSCULAR | Status: DC

## 2020-11-26 MED ORDER — PROMETHAZINE HCL 25 MG/ML IJ SOLN: 6.2500 mg | INTRAMUSCULAR | Status: DC | PRN

## 2020-11-26 MED ORDER — OXYCODONE-ACETAMINOPHEN 5-325 MG PO TABS: 1.0000 | ORAL_TABLET | ORAL | 0 refills | Status: DC | PRN

## 2020-11-26 SURGICAL SUPPLY — 41 items
BLADE SURG SZ11 CARB STEEL (BLADE) ×2 IMPLANT
CANISTER SUCT 1200ML W/VALVE (MISCELLANEOUS) ×2 IMPLANT
CATH ROBINSON RED A/P 16FR (CATHETERS) ×2 IMPLANT
CHLORAPREP W/TINT 26 (MISCELLANEOUS) ×2 IMPLANT
COVER WAND RF STERILE (DRAPES) ×2 IMPLANT
DERMABOND ADVANCED (GAUZE/BANDAGES/DRESSINGS) ×1
DERMABOND ADVANCED .7 DNX12 (GAUZE/BANDAGES/DRESSINGS) ×1 IMPLANT
DRSG TEGADERM 2-3/8X2-3/4 SM (GAUZE/BANDAGES/DRESSINGS) ×6 IMPLANT
DRSG TELFA 4X3 1S NADH ST (GAUZE/BANDAGES/DRESSINGS) IMPLANT
GAUZE 4X4 16PLY RFD (DISPOSABLE) ×2 IMPLANT
GLOVE INDICATOR 8.0 STRL GRN (GLOVE) ×4 IMPLANT
GLOVE SURG ENC MOIS LTX SZ8 (GLOVE) ×2 IMPLANT
GOWN STRL REUS W/ TWL LRG LVL3 (GOWN DISPOSABLE) ×1 IMPLANT
GOWN STRL REUS W/ TWL XL LVL3 (GOWN DISPOSABLE) ×1 IMPLANT
GOWN STRL REUS W/TWL LRG LVL3 (GOWN DISPOSABLE) ×1
GOWN STRL REUS W/TWL XL LVL3 (GOWN DISPOSABLE) ×1
GRASPER SUT TROCAR 14GX15 (MISCELLANEOUS) ×2 IMPLANT
IRRIGATION STRYKERFLOW (MISCELLANEOUS) ×1 IMPLANT
IRRIGATOR STRYKERFLOW (MISCELLANEOUS) ×2
IV LACTATED RINGERS 1000ML (IV SOLUTION) IMPLANT
KIT PINK PAD W/HEAD ARE REST (MISCELLANEOUS) ×2
KIT PINK PAD W/HEAD ARM REST (MISCELLANEOUS) ×1 IMPLANT
LABEL OR SOLS (LABEL) ×2 IMPLANT
MANIFOLD NEPTUNE II (INSTRUMENTS) ×2 IMPLANT
NEEDLE VERESS 14GA 120MM (NEEDLE) ×2 IMPLANT
NS IRRIG 500ML POUR BTL (IV SOLUTION) ×2 IMPLANT
PACK GYN LAPAROSCOPIC (MISCELLANEOUS) ×2 IMPLANT
PAD PREP 24X41 OB/GYN DISP (PERSONAL CARE ITEMS) ×2 IMPLANT
POUCH SPECIMEN RETRIEVAL 10MM (ENDOMECHANICALS) IMPLANT
SCISSORS METZENBAUM CVD 33 (INSTRUMENTS) IMPLANT
SET TUBE SMOKE EVAC HIGH FLOW (TUBING) ×2 IMPLANT
SHEARS HARMONIC ACE PLUS 36CM (ENDOMECHANICALS) IMPLANT
SLEEVE ENDOPATH XCEL 5M (ENDOMECHANICALS) ×2 IMPLANT
SOL PREP PROV IODINE SCRUB 4OZ (MISCELLANEOUS) ×2 IMPLANT
SPONGE GAUZE 2X2 8PLY STRL LF (GAUZE/BANDAGES/DRESSINGS) ×6 IMPLANT
STRAP SAFETY 5IN WIDE (MISCELLANEOUS) ×2 IMPLANT
SUT VIC AB 2-0 UR6 27 (SUTURE) IMPLANT
SUT VIC AB 4-0 PS2 18 (SUTURE) IMPLANT
SYR 10ML LL (SYRINGE) ×2 IMPLANT
TROCAR ENDO BLADELESS 11MM (ENDOMECHANICALS) IMPLANT
TROCAR XCEL NON-BLD 5MMX100MML (ENDOMECHANICALS) ×2 IMPLANT

## 2020-11-26 NOTE — Progress Notes (Signed)
Patient discharged home, discharge instructions given.  All questions answered.  Transported by auxiliary.

## 2020-11-26 NOTE — Anesthesia Preprocedure Evaluation (Signed)
Anesthesia Evaluation  Patient identified by MRN, date of birth, ID band Patient awake    Reviewed: Allergy & Precautions, H&P , NPO status , Patient's Chart, lab work & pertinent test results, reviewed documented beta blocker date and time   History of Anesthesia Complications Negative for: history of anesthetic complications  Airway Mallampati: I  TM Distance: >3 FB Neck ROM: full    Dental  (+) Dental Advidsory Given, Teeth Intact   Pulmonary neg shortness of breath, neg recent URI, Current Smoker,    Pulmonary exam normal breath sounds clear to auscultation       Cardiovascular Exercise Tolerance: Good negative cardio ROS Normal cardiovascular exam Rhythm:regular Rate:Normal     Neuro/Psych negative neurological ROS  negative psych ROS   GI/Hepatic Neg liver ROS, GERD  ,  Endo/Other  negative endocrine ROS  Renal/GU negative Renal ROS  negative genitourinary   Musculoskeletal   Abdominal   Peds  Hematology negative hematology ROS (+)   Anesthesia Other Findings History reviewed. No pertinent past medical history.   Reproductive/Obstetrics (+) Pregnancy Ectopic pregnancy                             Anesthesia Physical Anesthesia Plan  ASA: II and emergent  Anesthesia Plan: General   Post-op Pain Management:    Induction: Intravenous, Rapid sequence and Cricoid pressure planned  PONV Risk Score and Plan: 2 and Ondansetron, Dexamethasone, Midazolam, Treatment may vary due to age or medical condition and Promethazine  Airway Management Planned: Oral ETT  Additional Equipment:   Intra-op Plan:   Post-operative Plan: Extubation in OR  Informed Consent: I have reviewed the patients History and Physical, chart, labs and discussed the procedure including the risks, benefits and alternatives for the proposed anesthesia with the patient or authorized representative who has  indicated his/her understanding and acceptance.     Dental Advisory Given  Plan Discussed with: Anesthesiologist, CRNA and Surgeon  Anesthesia Plan Comments: (Pt is a 25 yo F with GERD who is presenting with ectopic pregnancy vs ruptured ovarian cyst here for emergent laparoscopy and repair.  Pt is NPO appropriate and last episode of emesis was >24 hours ago.  Will proceed with RSI at this time as pain leads to decreased gastric emptying.  Discussed risks of anesthesia with patient who agreed to proceed.)        Anesthesia Quick Evaluation

## 2020-11-26 NOTE — Discharge Summary (Signed)
Gynecology Physician Postoperative Discharge Summary  Patient ID: Jena Tegeler MRN: [id number] DOB/AGE: [birth year] 25 y.o.  Admit Date: 11/25/2020 Discharge Date: 11/26/2020  Preoperative Diagnoses: LLQ pain   PostOperative Diagnosis: LLQ pain and ovarian cyst  Procedures: Procedure(s) (LRB): DIAGNOSTIC LAPAROSCOPY (N/A)  Significant Labs: CBC Latest Ref Rng & Units 11/25/2020  WBC 4.0 - 10.5 K/uL 7.7  Hemoglobin 12.0 - 15.0 g/dL 25.8  Hematocrit 52.7 - 46.0 % 34.6(L)  Platelets 150 - 400 K/uL 313    Hospital Course:  Johnesha Acheampong is a 25 y.o. G2P0010  admitted for scheduled surgery.  She underwent the procedures as mentioned above, her operation was uncomplicated. For further details about surgery, please refer to the operative report. Patient had an uncomplicated postoperative course. By time of discharge on POD#1, her pain was controlled on oral pain medications; she was ambulating, voiding without difficulty, tolerating regular diet and passing flatus. She was deemed stable for discharge to home.   Discharge Exam: Blood pressure 117/69, pulse 77, temperature 98.2 F (36.8 C), temperature source Oral, resp. rate 18, height 5' 6.5" (1.689 m), weight 65.8 kg, last menstrual period 10/29/2020, SpO2 100 %. General appearance: alert and no distress  Resp: clear to auscultation bilaterally  Cardio: regular rate and rhythm  GI: soft, non-tender; bowel sounds normal; no masses, no organomegaly.  Incision: C/D/I, no erythema, no drainage noted Pelvic: scant blood on pad  Extremities: extremities normal, atraumatic, no cyanosis or edema and Homans sign is negative, no sign of DVT  Discharged Condition: Stable  Disposition: Discharge disposition: 01-Home or Self Care       Discharge Instructions    Call MD for:  persistant nausea and vomiting   Complete by: As directed    Call MD for:  redness, tenderness, or signs of infection (pain, swelling, redness, odor or  green/yellow discharge around incision site)   Complete by: As directed    Call MD for:  severe uncontrolled pain   Complete by: As directed    Call MD for:  temperature >100.4   Complete by: As directed    Diet - low sodium heart healthy   Complete by: As directed    Discharge instructions   Complete by: As directed    Resume activities according to discharge instruction sheets   If the dressing is still on your incision site when you go home, remove it on the third day after your surgery date. Remove dressing if it begins to fall off, or if it is dirty or damaged before the third day.   Complete by: As directed    Increase activity slowly   Complete by: As directed      Allergies as of 11/26/2020      Reactions   Latex       Medication List    STOP taking these medications   cyclobenzaprine 5 MG tablet Commonly known as: FLEXERIL   ketorolac 10 MG tablet Commonly known as: TORADOL     TAKE these medications   metroNIDAZOLE 500 MG tablet Commonly known as: Flagyl Take 1 tablet (500 mg total) by mouth 2 (two) times daily for 7 days.   multivitamin-prenatal 27-0.8 MG Tabs tablet Take 1 tablet by mouth daily at 12 noon.   ondansetron 4 MG disintegrating tablet Commonly known as: Zofran ODT Take 1 tablet (4 mg total) by mouth every 8 (eight) hours as needed for nausea or vomiting.   oxyCODONE-acetaminophen 5-325 MG tablet Commonly known as: PERCOCET/ROXICET Take 1 tablet by mouth  every 4 (four) hours as needed for moderate pain.            Discharge Care Instructions  (From admission, onward)         Start     Ordered   11/26/20 0000  If the dressing is still on your incision site when you go home, remove it on the third day after your surgery date. Remove dressing if it begins to fall off, or if it is dirty or damaged before the third day.        11/26/20 0937          Follow-up Information    [facility] EM[facility].    Specialty: Emergency Medicine Why: If symptoms worsen Contact information: [address] [id number] [place] [address] [phone]       Nadara Mustard, MD. Schedule an appointment as soon as possible for a visit in 1 day.   Specialty: Obstetrics and Gynecology Why: Magdalene Molly for lab draw Contact information: [address] [place] [address] [phone]               Annamarie Major, MD

## 2020-11-26 NOTE — ED Notes (Signed)
Rapid COVID swab in process.

## 2020-11-26 NOTE — Transfer of Care (Signed)
Immediate Anesthesia Transfer of Care Note  Patient: Kendra Ward  Procedure(s) Performed: DIAGNOSTIC LAPAROSCOPY (N/A Abdomen)  Patient Location: PACU  Anesthesia Type:General  Level of Consciousness: drowsy and patient cooperative  Airway & Oxygen Therapy: Patient Spontanous Breathing and Patient connected to face mask oxygen  Post-op Assessment: Report given to RN and Post -op Vital signs reviewed and stable  Post vital signs: Reviewed and stable  Last Vitals:  Vitals Value Taken Time  BP    Temp    Pulse    Resp    SpO2      Last Pain:  Vitals:   11/25/20 2222  TempSrc: Oral  PainSc: 0-No pain         Complications: No complications documented.

## 2020-11-26 NOTE — Op Note (Signed)
  Operative Note   11/26/2020  PRE-OP DIAGNOSIS: LLQ pain, Left adnexal mass    POST-OP DIAGNOSIS: LLQ pain, left ovarian cyst   PROCEDURE: Procedure(s): DIAGNOSTIC LAPAROSCOPY  Aspiration of free fluid in abdomen  SURGEON: Annamarie Major, MD, FACOG  ANESTHESIA: General   ESTIMATED BLOOD LOSS: Mon  COMPLICATIONS: None  DISPOSITION: PACU - hemodynamically stable.  CONDITION: stable  FINDINGS: Laparoscopic survey of the abdomen revealed a grossly normal uterus, tubes, liver edge, gallbladder edge and appendix, No intra-abdominal adhesions were noted. No ectopic was visualized.  Left ovarian cyst, small.  Normal right ovary.  PROCEDURE IN DETAIL: The patient was taken to the OR where anesthesia was administed. The patient was positioned in dorsal lithotomy in the [place] stirrups. The patient was then examined under anesthesia with the above noted findings. The patient was prepped and draped in the normal sterile fashion and in and out catheter was performed to empty bladder. A sponge stick was placed in the vagina.  Attention was turned to the patient's abdomen where a 5 mm skin incision was made in the umbilical fold, after injection of local anesthesia. The Veress step needle was carefully introduced into the peritoneal cavity with placement confirmed using the hanging drop technique.  Pneumoperitoneum was obtained. The 5 mm port was then placed under direct visualization with the operative laparoscope.  Trendelenburg positioning.  Additional 5 mm trocar was then placed in the suprapubic region under direct visualization with the laparoscope.  Instrumentation to visualize complete pelvic anatomy performed.  Free fluid was aspirated, about 230 mL of yellow clear fluid.  No blood.  Both tubes normal with no mass or enlargement.  Left ovarian cyst stable, not bleeding, small.  Instruments and trocars removed, gas expelled, and skin closed with skin adhesive glue.  Instrument, needle, and  sponge counts correct x2 at the conclusion of the case.  Pt goes to recovery room in stable condition.  Annamarie Major, MD, [facility], [facility] 11/26/2020  2:11 AM

## 2020-11-26 NOTE — H&P (Signed)
Obstetrics & Gynecology History and Physical Note  Date of Consultation: 11/26/2020   Requesting Provider: [facility]  Primary OBGYN: [facility] Primary Care Provider: System, Provider Not In  Reason for Consultation: LLQ pain  History of Present Illness: Ms. Kendra Ward is a 25 y.o. G2P0010 (Patient's last menstrual period was 10/29/2020.), with the above CC. She has 2 day h/o mild LLQ pain.  She has been on Letrozol, second cycle. For fertility.  She reports h/o ovarian cysts.  She has been having reg cycles.  No recent bleeding.  Pos home preg test.  ER Beta 479 [place] FF in Pelvis (a lot) and left adnexal cystic mass    No intrauterine fluid or gestation  ROS: A review of systems was performed and was complete and comprehensive, except as stated in the above HPI.  OBGYN History: As per HPI. OB History    Gravida  2   Para  0   Term      Preterm      AB  1   Living  0     SAB  1   IAB      Ectopic      Multiple      Live Births  0            Past Medical History: History reviewed. No pertinent past medical history.  Past Surgical History: Past Surgical History:  Procedure Laterality Date  . NO PAST SURGERIES      Family History:  History reviewed. No pertinent family history. She denies any female cancers, bleeding or blood clotting disorders.   Social History:  Social History   Socioeconomic History  . Marital status: Married    Spouse name: Not on file  . Number of children: Not on file  . Years of education: Not on file  . Highest education level: Not on file  Occupational History  . Not on file  Tobacco Use  . Smoking status: Current Every Day Smoker  . Smokeless tobacco: Never Used  Vaping Use  . Vaping Use: Never used  Substance and Sexual Activity  . Alcohol use: Not Currently  . Drug use: Yes    Types: Marijuana  . Sexual activity: Yes    Partners: Male    Birth control/protection: None  Other Topics Concern  . Not on file  Social  History Narrative  . Not on file   Social Determinants of Health   Financial Resource Strain: Not on file  Food Insecurity: Not on file  Transportation Needs: Not on file  Physical Activity: Not on file  Stress: Not on file  Social Connections: Not on file  Intimate Partner Violence: Not on file    Allergy: Allergies  Allergen Reactions  . Latex     Current Outpatient Medications: (Not in a hospital admission)   Hospital Medications: Current Facility-Administered Medications  Medication Dose Route Frequency Provider Last Rate Last Admin  . 0.9 %  sodium chloride infusion   Intravenous Continuous Ward, Kristen N, DO 100 mL/hr at 11/25/20 2359 New Bag at 11/25/20 2359   Current Outpatient Medications  Medication Sig Dispense Refill  . letrozole (FEMARA) 2.5 MG tablet Take 2.5 mg by mouth daily.    . metroNIDAZOLE (FLAGYL) 500 MG tablet Take 1 tablet (500 mg total) by mouth 2 (two) times daily for 7 days. 14 tablet 0  . ondansetron (ZOFRAN ODT) 4 MG disintegrating tablet Take 1 tablet (4 mg total) by mouth every 8 (eight) hours as needed  for nausea or vomiting. 12 tablet 0  . Prenatal Vit-Fe Fumarate-FA (MULTIVITAMIN-PRENATAL) 27-0.8 MG TABS tablet Take 1 tablet by mouth daily at 12 noon.    . cyclobenzaprine (FLEXERIL) 5 MG tablet Take 1 tablet (5 mg total) by mouth 3 (three) times daily as needed. 15 tablet 0  . ketorolac (TORADOL) 10 MG tablet Take 1 tablet (10 mg total) by mouth every 8 (eight) hours. 15 tablet 0    Physical Exam: Vitals:   11/25/20 2014 11/25/20 2222 11/26/20 0009  BP: 126/77 124/73 (!) 144/70  Pulse: 91 92 89  Resp: 17 18 20   Temp: 98.5 F (36.9 C) 98.2 F (36.8 C)   TempSrc: Oral Oral   SpO2: 100% 99% 96%  Weight: 65.8 kg      Temp:  [98.2 F (36.8 C)-98.5 F (36.9 C)] 98.2 F (36.8 C) (02/15 2222) Pulse Rate:  [89-92] 89 (02/16 0009) Resp:  [17-20] 20 (02/16 0009) BP: (124-144)/(70-77) 144/70 (02/16 0009) SpO2:  [96 %-100 %] 96 %  (02/16 0009) Weight:  [65.8 kg] 65.8 kg (02/15 2014) No intake/output data recorded. No intake/output data recorded. No intake or output data in the 24 hours ending 11/26/20 0010  Body mass index is 23.4 kg/m. Constitutional: Well nourished, well developed female in no acute distress.  HEENT: normal Neck:  Supple, normal appearance, and no thyromegaly  Cardiovascular:Regular rate and rhythm.  No murmurs, rubs or gallops. Respiratory:  Clear to auscultation bilateral. Normal respiratory effort Abdomen: positive bowel sounds and no masses, hernias; LLQ tender to palpation, non distended Neuro: grossly intact Psych:  Normal mood and affect.  Skin:  Warm and dry.  MS: normal gait and normal bilateral lower extremity strength/ROM/symmetry Lymphatic:  No inguinal lymphadenopathy.    Recent Labs  Lab 11/25/20 2016  WBC 7.7  HGB 12.3  HCT 34.6*  PLT 313   Recent Labs  Lab 11/25/20 2016  NA 135  K 3.3*  CL 105  CO2 23  BUN 11  CREATININE 0.65  CALCIUM 9.2  PROT 7.9  BILITOT 0.8  ALKPHOS 40  ALT 15  AST 21  GLUCOSE 105*   Imaging:  Ultrasound independently reviewed/interpreted by self.  Assessment: Kendra Ward is a 25 y.o. G2P0010 (Patient's last menstrual period was 10/29/2020.) who presented to the ED with complaints of LLQ pain; findings are consistent with either left ectopic or ruptured ovarian cyst  Pt counseled that it is early to tell if this is ectopic based on LMP, Beta, and 10/31/2020 findings.  Surgery could help differentiate between these diagnostic possibilities, yet surgery has risks.  Waiting for follow up beta quant levels also has its risks.  Plan: Diagnostic Laparoscopy and salpingostomy or salpingectomy based on findings  I have had a careful discussion with this patient about all the options available and the risk/benefits of each. I have fully informed this patient that surgery may subject her to a variety of discomforts and risks: She understands that  most patients have surgery with little difficulty, but problems can happen ranging from minor to fatal. These include nausea, vomiting, pain, bleeding, infection, poor healing, hernia, or formation of adhesions. Unexpected reactions may occur from any drug or anesthetic given. Unintended injury may occur to other pelvic or abdominal structures such as Fallopian tubes, ovaries, bladder, ureter (tube from kidney to bladder), or bowel. Nerves going from the pelvis to the legs may be injured. Any such injury may require immediate or later additional surgery to correct the problem. Excessive blood loss  requiring transfusion is very unlikely but possible. Dangerous blood clots may form in the legs or lungs. Physical and sexual activity will be restricted in varying degrees for an indeterminate period of time but most often 2-6 weeks.  Finally, she understands that it is impossible to list every possible undesirable effect and that the condition for which surgery is done is not always cured or significantly improved, and in rare cases may be even worse.Ample time was given to answer all questions.   Annamarie Major, MD, [facility], [facility] 11/26/2020  12:10 AM Pager [phone]

## 2020-11-26 NOTE — Anesthesia Procedure Notes (Signed)
Procedure Name: Intubation Date/Time: 11/26/2020 1:30 AM Performed by: Waldo Laine, CRNA Pre-anesthesia Checklist: Patient identified, Patient being monitored, Timeout performed, Emergency Drugs available and Suction available Patient Re-evaluated:Patient Re-evaluated prior to induction Oxygen Delivery Method: Circle system utilized Preoxygenation: Pre-oxygenation with 100% oxygen Induction Type: IV induction, Rapid sequence and Cricoid Pressure applied Laryngoscope Size: 3 and McGraph Grade View: Grade I Tube type: Oral Tube size: 7.0 mm Number of attempts: 1 Airway Equipment and Method: Stylet Placement Confirmation: ETT inserted through vocal cords under direct vision,  positive ETCO2 and breath sounds checked- equal and bilateral Secured at: 20 cm Tube secured with: Tape Dental Injury: Teeth and Oropharynx as per pre-operative assessment

## 2020-11-27 ENCOUNTER — Encounter: Payer: Self-pay | Admitting: Obstetrics & Gynecology

## 2020-11-27 ENCOUNTER — Other Ambulatory Visit: Payer: Self-pay

## 2020-11-27 DIAGNOSIS — R1030 Lower abdominal pain, unspecified: Secondary | ICD-10-CM

## 2020-11-27 NOTE — Anesthesia Postprocedure Evaluation (Signed)
Anesthesia Post Note  Patient: Kendra Ward  Procedure(s) Performed: DIAGNOSTIC LAPAROSCOPY (N/A Abdomen)  Patient location during evaluation: PACU Anesthesia Type: General Level of consciousness: awake and alert Pain management: pain level controlled Vital Signs Assessment: post-procedure vital signs reviewed and stable Respiratory status: spontaneous breathing, nonlabored ventilation, respiratory function stable and patient connected to nasal cannula oxygen Cardiovascular status: blood pressure returned to baseline and stable Postop Assessment: no apparent nausea or vomiting Anesthetic complications: no   No complications documented.   Last Vitals:  Vitals:   11/26/20 0504 11/26/20 0728  BP: (!) 109/54 117/69  Pulse: 73 77  Resp: 20 18  Temp: 36.8 C 36.8 C  SpO2: 100% 100%    Last Pain:  Vitals:   11/26/20 0830  TempSrc:   PainSc: 0-No pain                 Lenard Simmer

## 2020-11-28 ENCOUNTER — Other Ambulatory Visit: Payer: Self-pay | Admitting: Obstetrics & Gynecology

## 2020-11-28 ENCOUNTER — Other Ambulatory Visit: Payer: Self-pay

## 2020-11-28 DIAGNOSIS — R1030 Lower abdominal pain, unspecified: Secondary | ICD-10-CM

## 2020-11-28 LAB — BETA HCG QUANT (REF LAB): hCG Quant: 746 m[IU]/mL

## 2020-11-28 NOTE — Progress Notes (Signed)
Keep appt Mon at 430 but she is coming in at 400 to have lab work done first (as it will be shutting down at 430).  She is aware and will come then.

## 2020-12-01 ENCOUNTER — Other Ambulatory Visit: Payer: Self-pay

## 2020-12-01 ENCOUNTER — Ambulatory Visit (INDEPENDENT_AMBULATORY_CARE_PROVIDER_SITE_OTHER): Payer: Self-pay | Admitting: Obstetrics & Gynecology

## 2020-12-01 ENCOUNTER — Encounter: Payer: Self-pay | Admitting: Obstetrics & Gynecology

## 2020-12-01 ENCOUNTER — Ambulatory Visit: Payer: Self-pay | Admitting: Obstetrics & Gynecology

## 2020-12-01 VITALS — BP 100/60 | Ht 66.5 in | Wt 134.0 lb

## 2020-12-01 DIAGNOSIS — N926 Irregular menstruation, unspecified: Secondary | ICD-10-CM

## 2020-12-01 DIAGNOSIS — R1030 Lower abdominal pain, unspecified: Secondary | ICD-10-CM

## 2020-12-01 NOTE — Progress Notes (Signed)
  [name] presents post op from laparoscopy and assessment for ectopic (neg) and aspiration of fluid (about 250 mL clear cyst fluid) for pelvic pain, 1 week ago.  She is early pregnancy and there was concern for ectopic.  Since that time she has had rise in beta hCG levels from 479 to 746 range.  Subjective: Patient reports marked improvement in her preop symptoms.  She has no pain, which is improved from preop, and also so pain from surgery. Eating a regular diet without difficulty. The patient is not having any pain.  Activity: normal activities of daily living. Patient reports additional symptom's since surgery of No bleeding.  Objective: BP 100/60   Ht 5' 6.5" (1.689 m)   Wt 134 lb (60.8 kg)   LMP 10/29/2020   BMI 21.30 kg/m  Physical Exam Constitutional:      General: She is not in acute distress.    Appearance: She is well-developed and well-nourished.  Cardiovascular:     Rate and Rhythm: Normal rate.  Pulmonary:     Effort: Pulmonary effort is normal.  Abdominal:     General: There is no distension.     Palpations: Abdomen is soft.     Tenderness: There is no abdominal tenderness.     Comments: [facility]   Musculoskeletal:        General: Normal range of motion.  Neurological:     Mental Status: She is alert and oriented to person, place, and time.     Cranial Nerves: No cranial nerve deficit.  Skin:    General: Skin is warm and dry.  Psychiatric:        Mood and Affect: Mood and affect normal.     Assessment: s/p :  laparoscopy and aspiration of cyst fluid and ruling out visual evidence for ectopic pregnancy progressing well  Plan: Patient has done well after surgery with no apparent complications.  I have discussed the post-operative course to date, and the expected progress moving forward.  The patient understands what complications to be concerned about.  I will see the patient in routine follow up, or sooner if needed.     Activity plan: No restriction. Beta hCG level today Continue to assess for viable pregnancy [place] when >2000 beta hCG level PNV   Kendra Ward 12/01/2020, 3:07 PM

## 2020-12-01 NOTE — Patient Instructions (Signed)
Human Chorionic Gonadotropin Test Why am I having this test? A human chorionic gonadotropin (hCG) test is done to determine whether you are pregnant. It can also be used:  To diagnose an abnormal pregnancy.  To determine whether you have had a miscarriage or are at risk of one. What is being tested? This test checks the level of the human chorionic gonadotropin (hCG) hormone in the blood. This hormone is produced during pregnancy by the cells that form the placenta. The placenta is the organ that grows inside your uterus to nourish a developing baby. When you are pregnant, hCG can be detected in your blood or urine 7 to 8 days before your missed period. The amount of hCG continues to increase for the first 8-10 weeks of pregnancy. The presence of hCG in your blood can be measured with different types of tests. You may have:  A urine test. ? A urine test only shows whether there is hCG in your urine. It does not measure how much.  A qualitative blood test. ? This blood test only shows whether there is hCG in your blood. It does not measure how much.  A quantitative blood test. ? This type of blood test measures the amount of hCG in your blood. ? You may have this test to:  Diagnose an abnormal pregnancy.  Check whether you have had a miscarriage.  Determine whether you are at risk of a miscarriage.  Determine if treatment of an ectopic pregnancy is successful. What kind of sample is taken? Two kinds of samples may be collected to test for the hCG hormone.  Blood. It is usually collected by inserting a needle into a blood vessel.  Urine. It is usually collected by urinating into a germ-free (sterile) specimen cup.      How do I prepare for this test? No preparation is needed for a blood test.  Some preparation is needed for a urine test:  For best results, collect the sample the first time you urinate in the morning. That is when the concentration of hCG is highest.  Do not  drink too much fluid. Drink as you normally would, or as directed by your health care provider. Tell a health care provider about:  All medicines you are taking, including vitamins, herbs, eye drops, creams, and over-the-counter medicines.  Any blood in your urine. This may interfere with the result. How are the results reported? Depending on the type of test that you have, your test results may be reported as values. Your health care provider will compare your results to normal ranges that were established after testing a large group of people (reference ranges). Reference ranges may vary among labs and hospitals. For this test, common reference ranges that show absence of pregnancy are:  Quantitative hCG blood levels: less than 5 IU/L. Other results will be reported as either positive or negative. For this test, normal results (meaning the absence of pregnancy) are:  Negative for hCG in the urine test.  Negative for hCG in the qualitative blood test. What do the results mean? Urine and qualitative blood test  A negative result could mean: ? That you are not pregnant. ? That the test was done too early in your pregnancy to detect hCG in your blood or urine. If you still have other signs of pregnancy, the test will be repeated.  A positive result means: ? That you are most likely pregnant. Your health care provider may confirm your pregnancy with an ultrasound of   your uterus, if needed. Quantitative blood test Results of the quantitative hCG blood test will be reported as values. These values will be interpreted by your health care provider along with your medical history and symptoms you are experiencing. Results outside of expected ranges could mean that:  You are pregnant with twins.  You have abnormal growths in your uterus.  You have an ectopic pregnancy.  You may be experiencing a miscarriage. Talk with your health care provider about what your results mean. Questions to ask  your health care provider Ask your health care provider, or the department that is doing the test:  When will my results be ready?  How will I get my results?  What are my treatment options?  What other tests do I need?  What are my next steps? Summary  A human chorionic gonadotropin (hCG) test is done to determine whether you are pregnant.  When you are pregnant, hCG can be detected in your blood or urine 7 to 8 days before your missed period. HCG levels continue to go up for the first 8-10 weeks of pregnancy.  Your hCG level can be measured with different types of tests. You may have a urine test, a qualitative blood test, or a quantitative blood test.  Talk with your health care provider about what your test results mean. This information is not intended to replace advice given to you by your health care provider. Make sure you discuss any questions you have with your health care provider. Document Revised: 06/30/2020 Document Reviewed: 06/30/2020 Elsevier Patient Education  2021 Elsevier Inc.  

## 2020-12-02 ENCOUNTER — Other Ambulatory Visit: Payer: Self-pay | Admitting: Obstetrics & Gynecology

## 2020-12-02 DIAGNOSIS — N926 Irregular menstruation, unspecified: Secondary | ICD-10-CM

## 2020-12-02 LAB — BETA HCG QUANT (REF LAB): hCG Quant: 3848 m[IU]/mL

## 2020-12-02 NOTE — Progress Notes (Signed)
Sch early OB US and then NOB next week.  [place] ar [facility] Monday if possible. NOB w PH to follow, or AMS if needs to be Tues. Ordered.  Discussed reassuring results w patient.  Plan [place] and then NOB next.

## 2020-12-05 ENCOUNTER — Other Ambulatory Visit: Payer: Self-pay | Admitting: Obstetrics & Gynecology

## 2020-12-05 ENCOUNTER — Other Ambulatory Visit: Payer: Self-pay

## 2020-12-05 ENCOUNTER — Ambulatory Visit
Admission: RE | Admit: 2020-12-05 | Discharge: 2020-12-05 | Disposition: A | Discharge status: Home / Self Care | Payer: Medicaid Other | Source: Ambulatory Visit | Attending: Obstetrics & Gynecology | Admitting: Obstetrics & Gynecology

## 2020-12-05 DIAGNOSIS — O30001 Twin pregnancy, unspecified number of placenta and unspecified number of amniotic sacs, first trimester: Secondary | ICD-10-CM | POA: Insufficient documentation

## 2020-12-05 DIAGNOSIS — N926 Irregular menstruation, unspecified: Secondary | ICD-10-CM | POA: Insufficient documentation

## 2020-12-08 ENCOUNTER — Encounter: Payer: Self-pay | Admitting: Obstetrics & Gynecology

## 2020-12-08 ENCOUNTER — Ambulatory Visit (INDEPENDENT_AMBULATORY_CARE_PROVIDER_SITE_OTHER): Payer: Medicaid Other | Admitting: Obstetrics & Gynecology

## 2020-12-08 ENCOUNTER — Other Ambulatory Visit: Payer: Self-pay

## 2020-12-08 VITALS — BP 110/70 | Ht 66.5 in | Wt 133.0 lb

## 2020-12-08 DIAGNOSIS — O30041 Twin pregnancy, dichorionic/diamniotic, first trimester: Secondary | ICD-10-CM | POA: Diagnosis not present

## 2020-12-08 DIAGNOSIS — N83202 Unspecified ovarian cyst, left side: Secondary | ICD-10-CM | POA: Diagnosis not present

## 2020-12-08 DIAGNOSIS — Z3A01 Less than 8 weeks gestation of pregnancy: Secondary | ICD-10-CM

## 2020-12-08 DIAGNOSIS — O30003 Twin pregnancy, unspecified number of placenta and unspecified number of amniotic sacs, third trimester: Secondary | ICD-10-CM | POA: Insufficient documentation

## 2020-12-08 NOTE — Progress Notes (Signed)
  HPI: Pt had surgery and found to ovarian cyst fluid and cyst (left), no ectopic pregnancy.  Sicne that time beta has gone from 700 to 3800.  [place] last week reviewed today.  Pt reports no pain, bleeding, nausea.   Constipation is only sx of report.  Ultrasound demonstrates di-di gest sacs, c/w dates (pt was on Letrozol), no fetal pole yet (c/w 5 weeks 4 days). These findings are appropriate and reassuring.  PMHx: She  has no past medical history on file. Also,  has a past surgical history that includes No past surgeries and Diagnostic laparoscopy with removal of ectopic pregnancy (N/A, 11/26/2020)., family history is not on file.,  reports that she has been smoking. She has never used smokeless tobacco. She reports previous alcohol use. She reports current drug use. Drug: Marijuana.  She has a current medication list which includes the following prescription(s): ondansetron and multivitamin-prenatal. Also, is allergic to latex.  Review of Systems  All other systems reviewed and are negative.   Objective: BP 110/70   Ht 5' 6.5" (1.689 m)   Wt 133 lb (60.3 kg)   LMP 10/29/2020   BMI 21.15 kg/m   Physical examination Constitutional NAD, Conversant  Skin No rashes, lesions or ulceration.   Extremities: Moves all appropriately.  Normal ROM for age. No lymphadenopathy.  Neuro: Grossly intact  Psych: Oriented to PPT.  Normal mood. Normal affect.   Assessment:  Dichorionic diamniotic twin pregnancy in first trimester     - Plan: US OB LESS THAN 14 WEEKS WITH OB TRANSVAGINAL    Follow up    Counseled still awaiting a show of one or two fetal poles    Risks of miscarriage present, lessened after these results [redacted] weeks gestation of pregnancy    Info gv    PNV    Plan NOB and f/u US 2 weeks Cyst of left ovary    Recovering well from procedure    Monitor for worsening cyst  A total of 20 minutes were spent face-to-face with the patient as well as preparation, review, communication, and  documentation during this encounter.   Annamarie Major, MD, [facility], [facility] 12/08/2020  9:46 AM

## 2020-12-08 NOTE — Patient Instructions (Addendum)
Possible Due Date 08/03/2021   First Trimester of Pregnancy  The first trimester of pregnancy starts on the first day of your last menstrual period until the end of week 12. This is months 1 through 3 of pregnancy. A week after a sperm fertilizes an egg, the egg will implant into the wall of the uterus and begin to develop into a baby. By the end of 12 weeks, all the baby's organs will be formed and the baby will be 2-3 inches in size. Body changes during your first trimester Your body goes through many changes during pregnancy. The changes vary and generally return to normal after your baby is born. Physical changes  You may gain or lose weight.  Your breasts may begin to grow larger and become tender. The tissue that surrounds your nipples (areola) may become darker.  Dark spots or blotches (chloasma or mask of pregnancy) may develop on your face.  You may have changes in your hair. These can include thickening or thinning of your hair or changes in texture. Health changes  You may feel nauseous, and you may vomit.  You may have heartburn.  You may develop headaches.  You may develop constipation.  Your gums may bleed and may be sensitive to brushing and flossing. Other changes  You may tire easily.  You may urinate more often.  Your menstrual periods will stop.  You may have a loss of appetite.  You may develop cravings for certain kinds of food.  You may have changes in your emotions from day to day.  You may have more vivid and strange dreams. Follow these instructions at home: Medicines  Follow your health care provider's instructions regarding medicine use. Specific medicines may be either safe or unsafe to take during pregnancy. Do not take any medicines unless told to by your health care provider.  Take a prenatal vitamin that contains at least 600 micrograms (mcg) of folic acid. Eating and drinking  Eat a healthy diet that includes fresh fruits and  vegetables, whole grains, good sources of protein such as meat, eggs, or tofu, and low-fat dairy products.  Avoid raw meat and unpasteurized juice, milk, and cheese. These carry germs that can harm you and your baby.  If you feel nauseous or you vomit: ? Eat 4 or 5 small meals a day instead of 3 large meals. ? Try eating a few soda crackers. ? Drink liquids between meals instead of during meals.  You may need to take these actions to prevent or treat constipation: ? Drink enough fluid to keep your urine pale yellow. ? Eat foods that are high in fiber, such as beans, whole grains, and fresh fruits and vegetables. ? Limit foods that are high in fat and processed sugars, such as fried or sweet foods. Activity  Exercise only as directed by your health care provider. Most people can continue their usual exercise routine during pregnancy. Try to exercise for 30 minutes at least 5 days a week.  Stop exercising if you develop pain or cramping in the lower abdomen or lower back.  Avoid exercising if it is very hot or humid or if you are at high altitude.  Avoid heavy lifting.  If you choose to, you may have sex unless your health care provider tells you not to. Relieving pain and discomfort  Wear a good support bra to relieve breast tenderness.  Rest with your legs elevated if you have leg cramps or low back pain.  If you  develop bulging veins (varicose veins) in your legs: ? Wear support hose as told by your health care provider. ? Elevate your feet for 15 minutes, 3-4 times a day. ? Limit salt in your diet. Safety  Wear your seat belt at all times when driving or riding in a car.  Talk with your health care provider if someone is verbally or physically abusive to you.  Talk with your health care provider if you are feeling sad or have thoughts of hurting yourself. Lifestyle  Do not use hot tubs, steam rooms, or saunas.  Do not douche. Do not use tampons or scented sanitary  pads.  Do not use herbal remedies, alcohol, illegal drugs, or medicines that are not approved by your health care provider. Chemicals in these products can harm your baby.  Do not use any products that contain nicotine or tobacco, such as cigarettes, e-cigarettes, and chewing tobacco. If you need help quitting, ask your health care provider.  Avoid cat litter boxes and soil used by cats. These carry germs that can cause birth defects in the baby and possibly loss of the unborn baby (fetus) by miscarriage or stillbirth. General instructions  During routine prenatal visits in the first trimester, your health care provider will do a physical exam, perform necessary tests, and ask you how things are going. Keep all follow-up visits. This is important.  Ask for help if you have counseling or nutritional needs during pregnancy. Your health care provider can offer advice or refer you to specialists for help with various needs.  Schedule a dentist appointment. At home, brush your teeth with a soft toothbrush. Floss gently.  Write down your questions. Take them to your prenatal visits. Where to find more information  American Pregnancy Association: americanpregnancy.org  [employer]: https://www.todd-brady.net/  Office on [employer] [facility]: [employer]nz Contact a health care provider if you have:  Dizziness.  A fever.  Mild pelvic cramps, pelvic pressure, or nagging pain in the abdominal area.  Nausea, vomiting, or diarrhea that lasts for 24 hours or longer.  A bad-smelling vaginal discharge.  Pain when you urinate.  Known exposure to a contagious illness, such as chickenpox, measles, Zika virus, HIV, or hepatitis. Get help right away if you have:  Spotting or bleeding from your vagina.  Severe abdominal cramping or pain.  Shortness of breath or chest pain.  Any kind of trauma, such as from a fall or a car crash.  New  or increased pain, swelling, or redness in an arm or leg. Summary  The first trimester of pregnancy starts on the first day of your last menstrual period until the end of week 12 (months 1 through 3).  Eating 4 or 5 small meals a day rather than 3 large meals may help to relieve nausea and vomiting.  Do not use any products that contain nicotine or tobacco, such as cigarettes, e-cigarettes, and chewing tobacco. If you need help quitting, ask your health care provider.  Keep all follow-up visits. This is important. This information is not intended to replace advice given to you by your health care provider. Make sure you discuss any questions you have with your health care provider. Document Revised: 03/05/2020 Document Reviewed: 01/10/2020 Elsevier Patient Education  2021 [employer].

## 2020-12-15 ENCOUNTER — Other Ambulatory Visit: Payer: Self-pay | Admitting: Obstetrics and Gynecology

## 2020-12-15 MED ORDER — ONDANSETRON 4 MG PO TBDP: 4.0000 mg | ORAL_TABLET | Freq: Three times a day (TID) | ORAL | 0 refills | Status: DC | PRN

## 2020-12-15 MED ORDER — DOXYLAMINE-PYRIDOXINE 10-10 MG PO TBEC: 2.0000 | DELAYED_RELEASE_TABLET | Freq: Every day | ORAL | 5 refills | Status: DC

## 2020-12-15 NOTE — Telephone Encounter (Signed)
Please advise 

## 2020-12-16 ENCOUNTER — Telehealth: Payer: Self-pay

## 2020-12-16 ENCOUNTER — Other Ambulatory Visit: Payer: Self-pay | Admitting: Obstetrics & Gynecology

## 2020-12-16 MED ORDER — PROMETHAZINE HCL 25 MG PO TABS: 25.0000 mg | ORAL_TABLET | Freq: Four times a day (QID) | ORAL | 2 refills | Status: DC | PRN

## 2020-12-16 NOTE — Telephone Encounter (Signed)
Pt calling about the first rx this am that was going to be $390; [facility] is waiting for [place] to verify m'caid will cover it.  [phone]

## 2020-12-17 ENCOUNTER — Inpatient Hospital Stay (HOSPITAL_COMMUNITY)
Admission: AD | Admit: 2020-12-17 | Discharge: 2020-12-17 | Disposition: A | Discharge status: Home / Self Care | Payer: Medicaid Other | Attending: Obstetrics and Gynecology | Admitting: Obstetrics and Gynecology

## 2020-12-17 ENCOUNTER — Ambulatory Visit (HOSPITAL_COMMUNITY)
Admission: EM | Admit: 2020-12-17 | Discharge: 2020-12-17 | Disposition: A | Discharge status: Home / Self Care | Payer: Medicaid Other | Attending: Family Medicine | Admitting: Family Medicine

## 2020-12-17 ENCOUNTER — Encounter (HOSPITAL_COMMUNITY): Payer: Self-pay | Admitting: Obstetrics and Gynecology

## 2020-12-17 ENCOUNTER — Other Ambulatory Visit: Payer: Self-pay

## 2020-12-17 ENCOUNTER — Encounter (HOSPITAL_COMMUNITY): Payer: Self-pay

## 2020-12-17 DIAGNOSIS — E860 Dehydration: Secondary | ICD-10-CM | POA: Diagnosis not present

## 2020-12-17 DIAGNOSIS — O211 Hyperemesis gravidarum with metabolic disturbance: Secondary | ICD-10-CM | POA: Insufficient documentation

## 2020-12-17 DIAGNOSIS — O30001 Twin pregnancy, unspecified number of placenta and unspecified number of amniotic sacs, first trimester: Secondary | ICD-10-CM

## 2020-12-17 DIAGNOSIS — M545 Low back pain, unspecified: Secondary | ICD-10-CM | POA: Insufficient documentation

## 2020-12-17 DIAGNOSIS — Z3A08 8 weeks gestation of pregnancy: Secondary | ICD-10-CM | POA: Diagnosis not present

## 2020-12-17 DIAGNOSIS — R112 Nausea with vomiting, unspecified: Secondary | ICD-10-CM

## 2020-12-17 DIAGNOSIS — Z87891 Personal history of nicotine dependence: Secondary | ICD-10-CM | POA: Diagnosis not present

## 2020-12-17 DIAGNOSIS — O210 Mild hyperemesis gravidarum: Secondary | ICD-10-CM | POA: Diagnosis present

## 2020-12-17 DIAGNOSIS — O219 Vomiting of pregnancy, unspecified: Secondary | ICD-10-CM

## 2020-12-17 HISTORY — DX: Other specified health status: Z78.9

## 2020-12-17 LAB — POCT URINALYSIS DIPSTICK, ED / UC
Bilirubin Urine: NEGATIVE
Glucose, UA: NEGATIVE mg/dL
Hgb urine dipstick: NEGATIVE
Ketones, ur: NEGATIVE mg/dL
Leukocytes,Ua: NEGATIVE
Nitrite: NEGATIVE
Protein, ur: NEGATIVE mg/dL
Specific Gravity, Urine: 1.02 (ref 1.005–1.030)
Urobilinogen, UA: 2 mg/dL — ABNORMAL HIGH (ref 0.0–1.0)
pH: 8.5 — ABNORMAL HIGH (ref 5.0–8.0)

## 2020-12-17 MED ORDER — ONDANSETRON 4 MG PO TBDP
ORAL_TABLET | ORAL | Status: AC
  Filled 2020-12-17: qty 1

## 2020-12-17 MED ORDER — PROMETHAZINE HCL 25 MG/ML IJ SOLN
12.5000 mg | Freq: Once | INTRAMUSCULAR | Status: AC
  Administered 2020-12-17: 12.5 mg via INTRAVENOUS
  Filled 2020-12-17: qty 1

## 2020-12-17 MED ORDER — VITAMIN B-6 50 MG PO TABS: 50.0000 mg | ORAL_TABLET | Freq: Every day | ORAL | 1 refills | Status: DC

## 2020-12-17 MED ORDER — ONDANSETRON 4 MG PO TBDP
4.0000 mg | ORAL_TABLET | Freq: Once | ORAL | Status: AC
  Administered 2020-12-17: 4 mg via ORAL

## 2020-12-17 MED ORDER — PYRIDOXINE HCL 100 MG/ML IJ SOLN
100.0000 mg | Freq: Once | INTRAMUSCULAR | Status: AC
  Administered 2020-12-17: 100 mg via INTRAVENOUS
  Filled 2020-12-17: qty 1

## 2020-12-17 MED ORDER — LACTATED RINGERS IV SOLN: INTRAVENOUS | Status: DC

## 2020-12-17 NOTE — ED Triage Notes (Signed)
Pt in with c/o nausea, headache and back pain that started yesterday  States she was prescribed promethazine with no relief

## 2020-12-17 NOTE — MAU Note (Addendum)
Having vomiting for 2 wks but this wk is worse. Unable to eat much or keep anything down. Denies VB or vaginal d/c. Having lower back pain and headache. Tylenol helped h/a yesterday but has not taken any medicine today. Took promethazine last night and went to sleep but had to work today so did not take any. Goes to [facility]

## 2020-12-17 NOTE — Discharge Instructions (Signed)
Morning Sickness  Morning sickness is when a woman feels nauseous during pregnancy. This nauseous feeling may or may not come with vomiting. It often occurs in the morning, but it can be a problem at any time of day. Morning sickness is most common during the first trimester. In some cases, it may continue throughout pregnancy. Although morning sickness is unpleasant, it is usually harmless unless the woman develops severe and continual vomiting (hyperemesis gravidarum), a condition that requires more intense treatment. What are the causes? The exact cause of this condition is not known, but it seems to be related to normal hormonal changes that occur in pregnancy. What increases the risk? You are more likely to develop this condition if:  You experienced nausea or vomiting before your pregnancy.  You had morning sickness during a previous pregnancy.  You are pregnant with more than one baby, such as twins. What are the signs or symptoms? Symptoms of this condition include:  Nausea.  Vomiting. How is this diagnosed? This condition is usually diagnosed based on your signs and symptoms. How is this treated? In many cases, treatment is not needed for this condition. Making some changes to what you eat may help to control symptoms. Your health care provider may also prescribe or recommend:  Vitamin B6 supplements.  Anti-nausea medicines.  Ginger. Follow these instructions at home: Medicines  Take over-the-counter and prescription medicines only as told by your health care provider. Do not use any prescription, over-the-counter, or herbal medicines for morning sickness without first talking with your health care provider.  Take multivitamins before getting pregnant. This can prevent or decrease the severity of morning sickness in most women. Eating and drinking  Eat a piece of dry toast or crackers before getting out of bed in the morning.  Eat 5 or 6 small meals a day.  Eat dry  and bland foods, such as rice or a baked potato. Foods that are high in carbohydrates are often helpful.  Avoid greasy, fatty, and spicy foods.  Have someone cook for you if the smell of any food causes nausea and vomiting.  If you feel nauseous after taking prenatal vitamins, take the vitamins at night or with a snack.  Eat a protein snack between meals if you are hungry. Nuts, yogurt, and cheese are good options.  Drink fluids throughout the day.  Try ginger ale made with real ginger, ginger tea made from fresh grated ginger, or ginger candies. General instructions  Do not use any products that contain nicotine or tobacco. These products include cigarettes, chewing tobacco, and vaping devices, such as e-cigarettes. If you need help quitting, ask your health care provider.  Get an air purifier to keep the air in your house free of odors.  Get plenty of fresh air.  Try to avoid odors that trigger your nausea.  Consider trying these methods to help relieve symptoms: ? Wearing an acupressure wristband. These wristbands are often worn for seasickness. ? Acupuncture. Contact a health care provider if:  Your home remedies are not working and you need medicine.  You feel dizzy or light-headed.  You are losing weight. Get help right away if:  You have persistent and uncontrolled nausea and vomiting.  You faint.  You have severe pain in your abdomen. Summary  Morning sickness is when a woman feels nauseous during pregnancy. This nauseous feeling may or may not come with vomiting.  Morning sickness is most common during the first trimester.  It often occurs in the   morning, but it can be a problem at any time of day.  In many cases, treatment is not needed for this condition. Making some changes to what you eat may help to control symptoms. This information is not intended to replace advice given to you by your health care provider. Make sure you discuss any questions you have  with your health care provider. Document Revised: 05/12/2020 Document Reviewed: 04/21/2020 Elsevier Patient Education  2021 Elsevier Inc.  

## 2020-12-17 NOTE — Progress Notes (Signed)
Written and verbal d/c instructions given and understanding voiced. 

## 2020-12-17 NOTE — MAU Provider Note (Signed)
Chief Complaint: Emesis During Pregnancy   Event Date/Time   First Provider Initiated Contact with Patient 12/17/20 2017        SUBJECTIVE HPI: Kendra Ward is a 25 y.o. G2P0010 at [redacted]w[redacted]d by LMP who presents to maternity admissions reporting nausea, vomiting and constipation. Took Phenergan last night and slept.  Has zofran at home but did not take it.  Did not take anything today.  . She denies vaginal bleeding, vaginal itching/burning, urinary symptoms, h/a, dizziness, or fever/chills.   Emesis  This is a recurrent problem. The current episode started yesterday. The problem has been unchanged. There has been no fever. Pertinent negatives include no abdominal pain, chest pain, chills, coughing, diarrhea, dizziness, fever or myalgias. She has tried nothing for the symptoms.   RN Note: Having vomiting for 2 wks but this wk is worse. Unable to eat much or keep anything down. Denies VB or vaginal d/c. Having lower back pain and headache. Tylenol helped h/a yesterday but has not taken any medicine today. Took promethazine last night and went to sleep but had to work today so did not take any. Goes to [facility]  Past Medical History:  Diagnosis Date  . Medical history non-contributory    Past Surgical History:  Procedure Laterality Date  . DIAGNOSTIC LAPAROSCOPY WITH REMOVAL OF ECTOPIC PREGNANCY N/A 11/26/2020   Procedure: DIAGNOSTIC LAPAROSCOPY;  Surgeon: Nadara Mustard, MD;  Location: [facility] ORS;  Service: Gynecology;  Laterality: N/A;   Social History   Socioeconomic History  . Marital status: Married    Spouse name: Not on file  . Number of children: Not on file  . Years of education: Not on file  . Highest education level: Not on file  Occupational History  . Not on file  Tobacco Use  . Smoking status: Former Games developer  . Smokeless tobacco: Never Used  Vaping Use  . Vaping Use: Never used  Substance and Sexual Activity  . Alcohol use: Not Currently  . Drug use: Yes     Types: Marijuana    Comment: has not smoked since since before lap  . Sexual activity: Yes    Partners: Male    Birth control/protection: None  Other Topics Concern  . Not on file  Social History Narrative  . Not on file   Social Determinants of Health   Financial Resource Strain: Not on file  Food Insecurity: Not on file  Transportation Needs: Not on file  Physical Activity: Not on file  Stress: Not on file  Social Connections: Not on file  Intimate Partner Violence: Not on file   No current facility-administered medications on file prior to encounter.   Current Outpatient Medications on File Prior to Encounter  Medication Sig Dispense Refill  . Prenatal Vit-Fe Fumarate-FA (MULTIVITAMIN-PRENATAL) 27-0.8 MG TABS tablet Take 1 tablet by mouth daily at 12 noon.    . promethazine (PHENERGAN) 25 MG tablet Take 1 tablet (25 mg total) by mouth every 6 (six) hours as needed for nausea or vomiting. 30 tablet 2   Allergies  Allergen Reactions  . Latex     I have reviewed patient's Past Medical Hx, Surgical Hx, Family Hx, Social Hx, medications and allergies.   ROS:  Review of Systems  Constitutional: Negative for chills and fever.  Respiratory: Negative for cough.   Cardiovascular: Negative for chest pain.  Gastrointestinal: Positive for vomiting. Negative for abdominal pain and diarrhea.  Musculoskeletal: Negative for myalgias.  Neurological: Negative for dizziness.   Review of  Systems  Other systems negative   Physical Exam  Physical Exam Patient Vitals for the past 24 hrs:  BP Temp Pulse Resp Height Weight  12/17/20 1949 118/75 97.9 F (36.6 C) 77 16 5' 6.5" (1.689 m) 59.9 kg   Constitutional: Well-developed, well-nourished female in no acute distress.  Cardiovascular: normal rate Respiratory: normal effort GI: Abd soft, non-tender.  MS: Extremities nontender, no edema, normal ROM Neurologic: Alert and oriented x 4.  GU: Neg CVAT.  PELVIC EXAM: deferred  LAB  RESULTS Results for orders placed or performed during the hospital encounter of 12/17/20 (from the past 24 hour(s))  POC Urinalysis dipstick     Status: Abnormal   Collection Time: 12/17/20  7:13 PM  Result Value Ref Range   Glucose, UA NEGATIVE NEGATIVE mg/dL   Bilirubin Urine NEGATIVE NEGATIVE   Ketones, ur NEGATIVE NEGATIVE mg/dL   Specific Gravity, Urine 1.020 1.005 - 1.030   Hgb urine dipstick NEGATIVE NEGATIVE   pH 8.5 (H) 5.0 - 8.0   Protein, ur NEGATIVE NEGATIVE mg/dL   Urobilinogen, UA 2.0 (H) 0.0 - 1.0 mg/dL   Nitrite NEGATIVE NEGATIVE   Leukocytes,Ua NEGATIVE NEGATIVE    --/--/O POS (02/15 2334)  IMAGING   MAU Management/MDM: Ordered LR infusion and Phenergan/B6 meds for nausea Will reassess after infusion. States she feels much better and is tolerating PO intake after infusion and meds Discussed management of nausea at home  ASSESSMENT Twin Gestation at [redacted]w[redacted]d Nausea and vomiting of pregnancy Mild dehydration  PLAN Discharge home Has Zofran and Phenergan at home Recommend using zofran in the day and phenergan at night Rx B to supplement regime Pt stable at time of discharge. Encouraged to return here if she develops worsening of symptoms, increase in pain, fever, or other concerning symptoms.    Wynelle Bourgeois CNM, MSN Certified Nurse-Midwife 12/17/2020  8:17 PM

## 2020-12-17 NOTE — ED Notes (Signed)
Patient is being discharged from the Urgent Care and sent to the MAU via POV . Per Dr. Tracie Harrier patient is in need of higher level of care due to intractable n/v. Patient is aware and verbalizes understanding of plan of care.  Vitals:   12/17/20 1829  BP: 117/60  Pulse: 74  Resp: 17  Temp: 98.1 F (36.7 C)  SpO2: 100%

## 2020-12-20 NOTE — ED Provider Notes (Signed)
[facility]   [id number] 12/17/20 Arrival Time: 1738  ASSESSMENT & PLAN:  1. Intractable vomiting with nausea, unspecified vomiting type     To:  Follow-up Information    Go to  [facility] (MAU).   Contact information: [address] [place],  [place]  [address]              Meds ordered this encounter  Medications  . ondansetron (ZOFRAN-ODT) disintegrating tablet 4 mg    Stable upon d/c.  Reviewed expectations re: course of current medical issues. Questions answered. Outlined signs and symptoms indicating need for more acute intervention. Patient verbalized understanding. After Visit Summary given.   SUBJECTIVE: History from: patient.  Kendra Ward is a 25 y.o. female (G2P0010) who presents with persistent intractable n/v. Worsened yesterday but present over a couple of weeks. Afebrile. Without abd pain. No urinary symptoms. Tried taking phenergan but this is not helping. Decreased PO intake. Currently approx 8 w pregnant. No vag bleeding.  Patient's last menstrual period was 10/29/2020.  Past Surgical History:  Procedure Laterality Date  . DIAGNOSTIC LAPAROSCOPY WITH REMOVAL OF ECTOPIC PREGNANCY N/A 11/26/2020   Procedure: DIAGNOSTIC LAPAROSCOPY;  Surgeon: Nadara Mustard, MD;  Location: [facility] ORS;  Service: Gynecology;  Laterality: N/A;    OBJECTIVE:  Vitals:   12/17/20 1829  BP: 117/60  Pulse: 74  Resp: 17  Temp: 98.1 F (36.7 C)  SpO2: 100%    General appearance: alert; no distress Oropharynx: dry Abdomen: soft; non-distended; no significant abdominal tenderness; no masses or organomegaly; no guarding or rebound tenderness Back: no CVA tenderness Extremities: no edema; symmetrical with no gross deformities Skin: warm; dry Neurologic: normal gait Psychological: alert and cooperative; normal mood and affect  Labs: Results for orders placed or performed during the hospital encounter  of 12/17/20  POC Urinalysis dipstick  Result Value Ref Range   Glucose, UA NEGATIVE NEGATIVE mg/dL   Bilirubin Urine NEGATIVE NEGATIVE   Ketones, ur NEGATIVE NEGATIVE mg/dL   Specific Gravity, Urine 1.020 1.005 - 1.030   Hgb urine dipstick NEGATIVE NEGATIVE   pH 8.5 (H) 5.0 - 8.0   Protein, ur NEGATIVE NEGATIVE mg/dL   Urobilinogen, UA 2.0 (H) 0.0 - 1.0 mg/dL   Nitrite NEGATIVE NEGATIVE   Leukocytes,Ua NEGATIVE NEGATIVE   Labs Reviewed  POCT URINALYSIS DIPSTICK, ED / UC - Abnormal; Notable for the following components:      Result Value   pH 8.5 (*)    Urobilinogen, UA 2.0 (*)    All other components within normal limits    Allergies  Allergen Reactions  . Latex                                                Past Medical History:  Diagnosis Date  . Medical history non-contributory    Social History   Socioeconomic History  . Marital status: Married    Spouse name: Not on file  . Number of children: Not on file  . Years of education: Not on file  . Highest education level: Not on file  Occupational History  . Not on file  Tobacco Use  . Smoking status: Former Games developer  . Smokeless tobacco: Never Used  Vaping Use  . Vaping Use: Never used  Substance and Sexual Activity  . Alcohol use: Not  Currently  . Drug use: Yes    Types: Marijuana    Comment: has not smoked since since before lap  . Sexual activity: Yes    Partners: Male    Birth control/protection: None  Other Topics Concern  . Not on file  Social History Narrative  . Not on file   Social Determinants of Health   Financial Resource Strain: Not on file  Food Insecurity: Not on file  Transportation Needs: Not on file  Physical Activity: Not on file  Stress: Not on file  Social Connections: Not on file  Intimate Partner Violence: Not on file   History reviewed. No pertinent family history.   Mardella Layman, MD 12/20/20 [phone]

## 2020-12-24 ENCOUNTER — Ambulatory Visit (INDEPENDENT_AMBULATORY_CARE_PROVIDER_SITE_OTHER): Payer: Medicaid Other

## 2020-12-24 ENCOUNTER — Other Ambulatory Visit (HOSPITAL_COMMUNITY)
Admission: RE | Admit: 2020-12-24 | Discharge: 2020-12-24 | Disposition: A | Discharge status: Home / Self Care | Payer: Medicaid Other | Source: Ambulatory Visit | Attending: Obstetrics and Gynecology | Admitting: Obstetrics and Gynecology

## 2020-12-24 ENCOUNTER — Other Ambulatory Visit: Payer: Self-pay

## 2020-12-24 ENCOUNTER — Other Ambulatory Visit: Payer: Medicaid Other

## 2020-12-24 ENCOUNTER — Encounter: Payer: Medicaid Other | Admitting: Obstetrics and Gynecology

## 2020-12-24 ENCOUNTER — Other Ambulatory Visit: Payer: Self-pay | Admitting: Obstetrics & Gynecology

## 2020-12-24 ENCOUNTER — Ambulatory Visit (INDEPENDENT_AMBULATORY_CARE_PROVIDER_SITE_OTHER): Payer: Medicaid Other | Admitting: Obstetrics and Gynecology

## 2020-12-24 ENCOUNTER — Encounter: Payer: Self-pay | Admitting: Obstetrics and Gynecology

## 2020-12-24 VITALS — BP 118/74 | Wt 134.0 lb

## 2020-12-24 DIAGNOSIS — O0991 Supervision of high risk pregnancy, unspecified, first trimester: Secondary | ICD-10-CM | POA: Diagnosis not present

## 2020-12-24 DIAGNOSIS — Z369 Encounter for antenatal screening, unspecified: Secondary | ICD-10-CM

## 2020-12-24 DIAGNOSIS — O30041 Twin pregnancy, dichorionic/diamniotic, first trimester: Secondary | ICD-10-CM | POA: Diagnosis not present

## 2020-12-24 DIAGNOSIS — Z3A08 8 weeks gestation of pregnancy: Secondary | ICD-10-CM

## 2020-12-24 DIAGNOSIS — Z124 Encounter for screening for malignant neoplasm of cervix: Secondary | ICD-10-CM

## 2020-12-24 DIAGNOSIS — O0990 Supervision of high risk pregnancy, unspecified, unspecified trimester: Secondary | ICD-10-CM | POA: Insufficient documentation

## 2020-12-24 NOTE — Progress Notes (Signed)
New Obstetric Patient H&P   Chief Complaint: "Desires prenatal care"  History of Present Illness: Patient is a 25 y.o. G2P0010 Not Hispanic or Latino female, certain LMP 10/29/2020 presents with amenorrhea and positive home pregnancy test. Based on her  LMP, her EDD is Estimated Date of Delivery: 08/05/21 and her EGA is 5870w0d. Her last pap smear was a while ago and it was normal.   Since her LMP she claims she has experienced pain. She underwent a diagnostic laparoscopy due to ruptured cyst.  She also has had nausea. [facility].  She states she is doing a little better. She denies vaginal bleeding. Her past medical history is noncontributory. Her prior pregnancies are notable for no issues. She note she has been smoking marijuana before the pregnancy.   Since her LMP, she admits to the use of tobacco products  no She claims she has lost 17pounds since the start of her pregnancy.  There are cats in the home in the home  no  She admits close contact with children on a regular basis  yes  She has had chicken pox in the past yes She has had Tuberculosis exposures, symptoms, or previously tested positive for TB   no Current or past history of domestic violence. no  Genetic Screening/Teratology Counseling: (Includes patient, baby's father, or anyone in either family with:)   1. [facility]'s [facility]/= 6435 [facility]  no 2. Thalassemia ([place], [place], Mediterranean, or Asian background): MCV<80  no 3. Neural tube defect (meningomyelocele, spina bifida, anencephaly)  no 4. Congenital heart defect  no  5. Down syndrome  no 6. Tay-Sachs (Jewish, [place])  no 7. Canavan's Disease  no 8. Sickle cell disease or trait (African)  no  9. Hemophilia or other blood disorders  no  10. Muscular dystrophy  no  11. Cystic fibrosis  no  12. Huntington's Chorea  no  13. Mental retardation/autism  no 14. Other inherited genetic or chromosomal disorder  no 15.  Maternal metabolic disorder (DM, PKU, etc)  no 16. Patient or FOB with a child with a birth defect not listed above no  16a. Patient or FOB with a birth defect themselves no 17. Recurrent pregnancy loss, or stillbirth  no  18. Any medications since LMP other than prenatal vitamins (include vitamins, supplements, OTC meds, drugs, alcohol)  Diclegis, phenergan 19. Any other genetic/environmental exposure to discuss  no  Infection History:   1. Lives with someone with TB or TB exposed  no  2. Patient or partner has history of genital herpes  no 3. Rash or viral illness since LMP  no 4. History of STI (GC, CT, HPV, syphilis, HIV)  no 5. History of recent travel :  no  Other pertinent information:  no     Review of Systems:10 point review of systems negative unless otherwise noted in HPI  Past Medical History:  Diagnosis Date  . Medical history non-contributory     Past Surgical History:  Procedure Laterality Date  . DIAGNOSTIC LAPAROSCOPY WITH REMOVAL OF ECTOPIC PREGNANCY N/A 11/26/2020   Procedure: DIAGNOSTIC LAPAROSCOPY;  Surgeon: Nadara MustardHarris, Robert P, MD;  Location: [facility] ORS;  Service: Gynecology;  Laterality: N/A;    Gynecologic History: Patient's last menstrual period was 10/29/2020.  Obstetric History: G2P0010  History reviewed. No pertinent family history.  Social History   Socioeconomic History  . Marital status: Married    Spouse name: Not on file  .  Number of children: Not on file  . Years of education: Not on file  . Highest education level: Not on file  Occupational History  . Not on file  Tobacco Use  . Smoking status: Former Games developer  . Smokeless tobacco: Never Used  Vaping Use  . Vaping Use: Never used  Substance and Sexual Activity  . Alcohol use: Not Currently  . Drug use: Yes    Types: Marijuana    Comment: has not smoked since since before lap  . Sexual activity: Yes    Partners: Male    Birth control/protection: None  Other Topics Concern  . Not  on file  Social History Narrative  . Not on file   Social Determinants of Health   Financial Resource Strain: Not on file  Food Insecurity: Not on file  Transportation Needs: Not on file  Physical Activity: Not on file  Stress: Not on file  Social Connections: Not on file  Intimate Partner Violence: Not on file    Allergies  Allergen Reactions  . Latex     Prior to Admission medications   Medication Sig Start Date End Date Taking? Authorizing Provider  DICLEGIS 10-10 MG TBEC Take by mouth. 12/16/20  Yes [provider]  Prenatal Vit-Fe Fumarate-FA (MULTIVITAMIN-PRENATAL) 27-0.8 MG TABS tablet Take 1 tablet by mouth daily at 12 noon.   Yes [provider]  promethazine (PHENERGAN) 25 MG tablet Take 1 tablet (25 mg total) by mouth every 6 (six) hours as needed for nausea or vomiting. Patient not taking: Reported on 12/24/2020 12/16/20   Nadara Mustard, MD  pyridOXINE (VITAMIN B-6) 50 MG tablet Take 1 tablet (50 mg total) by mouth daily. Patient not taking: Reported on 12/24/2020 12/17/20   Aviva Signs, CNM    Physical Exam BP 118/74   Wt 134 lb (60.8 kg)   LMP 10/29/2020   BMI 21.30 kg/m   Physical Exam Constitutional:      General: She is not in acute distress.    Appearance: Normal appearance. She is well-developed.  Genitourinary:     Vulva, bladder and urethral meatus normal.     Right Labia: No rash, tenderness, lesions, skin changes or Bartholin's cyst.    Left Labia: No tenderness, skin changes, Bartholin's cyst or rash.    No inguinal adenopathy present in the right or left side.    Pelvic Tanner Score: 5/5.     Right Adnexa: not tender, not full and no mass present.    Left Adnexa: not tender, not full and no mass present.    No cervical motion tenderness, friability, lesion or polyp.     Uterus is not enlarged, fixed or tender.     Uterus is anteverted.     No urethral tenderness or mass present.     Pelvic exam was performed with patient  in the lithotomy position.  HENT:     Head: Normocephalic and atraumatic.  Eyes:     General: No scleral icterus.    Conjunctiva/sclera: Conjunctivae normal.  Cardiovascular:     Rate and Rhythm: Normal rate and regular rhythm.     Heart sounds: No murmur heard. No friction rub. No gallop.   Pulmonary:     Effort: Pulmonary effort is normal. No respiratory distress.     Breath sounds: Normal breath sounds. No wheezing or rales.  Abdominal:     General: Bowel sounds are normal. There is no distension.     Palpations: Abdomen is soft. There  is no mass.     Tenderness: There is no abdominal tenderness. There is no guarding or rebound.     Hernia: There is no hernia in the left inguinal area or right inguinal area.  Musculoskeletal:        General: Normal range of motion.     Cervical back: Normal range of motion and neck supple.  Lymphadenopathy:     Lower Body: No right inguinal adenopathy. No left inguinal adenopathy.  Neurological:     General: No focal deficit present.     Mental Status: She is alert and oriented to person, place, and time.     Cranial Nerves: No cranial nerve deficit.  Skin:    General: Skin is warm and dry.     Findings: No erythema.  Psychiatric:        Mood and Affect: Mood normal.        Behavior: Behavior normal.        Judgment: Judgment normal.    Female Chaperone present during breast and/or pelvic exam.  Imaging Results US OB Comp Less 14 Wks  Result Date: 12/24/2020 Patient Name: Kendra Ward DOB: 09/07/1996 MRN: [id number] ULTRASOUND REPORT Location: [facility] Date of Service: 12/24/2020 Indications:dating Findings: Di/Di intrauterine pregnancy is visualized  BABY A CRL consistent with [redacted]w[redacted]d gestation, giving an (U/S) EDD of 08/02/21. The (U/S) EDD is consistent with the clinically established EDD of 08/05/21.  FHR: 172 BPM CRL measurement: 18.7 mm Yolk sac is visualized and appears normal. Amnion: visualized and appears normal  BABY  B CRL consistent with [redacted]w[redacted]d gestation, giving an (U/S) EDD of 08/02/21. The (U/S) EDD is consistent with the clinically established EDD of 08/05/21.  FHR: 171 BPM CRL measurement: 18.7 mm Yolk sac is visualized and appears normal. Amnion: visualized and appears normal  Right Ovary is normal in appearance. Left Ovary with two large cysts measuring 5.6 and 4.0cm Survey of the adnexa demonstrates no adnexal masses. There is no free peritoneal fluid in the cul de sac.  Impression: 1. [redacted]w[redacted]d Viable DI/DI Intrauterine pregnancy by U/S. 2. (U/S) EDD is consistent with Clinically established EDD of 08/05/21.  Darlina Guys, RT  There is a viable singleton gestation.  Detailed evaluation of the fetal anatomy is precluded by early gestational age.  It must be noted that a normal ultrasound particular at this early gestational age is unable to rule out fetal aneuploidy, risk of first trimester miscarriage, or anatomic birth defects.  Thomasene Mohair, MD, [facility], [facility] 12/24/2020 2:54 PM    US OB Comp AddL Gest Less 14 Wks  Result Date: 12/24/2020 Patient Name: Kendra Ward DOB: 1996/08/03 MRN: [id number] ULTRASOUND REPORT Location: [facility] Date of Service: 12/24/2020 Indications:dating Findings: Di/Di intrauterine pregnancy is visualized BABY A CRL consistent with [redacted]w[redacted]d gestation, giving an (U/S) EDD of 08/02/21. The (U/S) EDD is consistent with the clinically established EDD of 08/05/21. FHR: 172 BPM CRL measurement: 18.7 mm Yolk sac is visualized and appears normal. Amnion: visualized and appears normal BABY B CRL consistent with [redacted]w[redacted]d gestation, giving an (U/S) EDD of 08/02/21. The (U/S) EDD is consistent with the clinically established EDD of 08/05/21. FHR: 171 BPM CRL measurement: 18.7 mm Yolk sac is visualized and appears normal. Amnion: visualized and appears normal Right Ovary is normal in appearance. Left Ovary with two large cysts measuring 5.6 and 4.0cm Survey of  the adnexa demonstrates no adnexal masses. There is no free peritoneal fluid in the cul de sac.  Impression: 1. [redacted]w[redacted]d Viable DI/DI Intrauterine pregnancy by U/S. 2. (U/S) EDD is consistent with Clinically established EDD of 08/05/21. Darlina Guys, RT There is a viable singleton gestation.  Detailed evaluation of the fetal anatomy is precluded by early gestational age.  It must be noted that a normal ultrasound particular at this early gestational age is unable to rule out fetal aneuploidy, risk of first trimester miscarriage, or anatomic birth defects. Thomasene Mohair, MD, [facility], [facility] 12/24/2020 2:54 PM       Assessment: 25 y.o. G2P0010 at [redacted]w[redacted]d presenting to initiate prenatal care  Plan: 1) Avoid alcoholic beverages. 2) Patient encouraged not to smoke.  3) Discontinue the use of all non-medicinal drugs and chemicals.  4) Take prenatal vitamins daily.  5) Nutrition, food safety (fish, cheese advisories, and high nitrite foods) and exercise discussed. 6) Hospital and practice style discussed with cross coverage system.  7) Genetic Screening, such as with 1st Trimester Screening, cell free fetal DNA, AFP testing, and Ultrasound, as well as with amniocentesis and CVS as appropriate, is discussed with patient. At the conclusion of today's visit patient undecided genetic testing 8) Patient is asked about travel to areas at risk for the [place] virus, and counseled to avoid travel and exposure to mosquitoes or sexual partners who may have themselves been exposed to the virus. Testing is discussed, and will be ordered as appropriate.   Thomasene Mohair, MD 12/24/2020 2:58 PM

## 2020-12-26 LAB — CYTOLOGY - PAP: Diagnosis: NEGATIVE

## 2020-12-29 ENCOUNTER — Emergency Department
Admission: EM | Admit: 2020-12-29 | Discharge: 2020-12-29 | Disposition: A | Discharge status: Home / Self Care | Payer: Medicaid Other | Attending: Emergency Medicine | Admitting: Emergency Medicine

## 2020-12-29 ENCOUNTER — Inpatient Hospital Stay (HOSPITAL_COMMUNITY)
Admission: AD | Admit: 2020-12-29 | Discharge: 2020-12-30 | Disposition: A | Discharge status: Home / Self Care | Payer: Medicaid Other | Attending: Obstetrics and Gynecology | Admitting: Obstetrics and Gynecology

## 2020-12-29 ENCOUNTER — Other Ambulatory Visit: Payer: Self-pay

## 2020-12-29 DIAGNOSIS — O26891 Other specified pregnancy related conditions, first trimester: Secondary | ICD-10-CM | POA: Diagnosis not present

## 2020-12-29 DIAGNOSIS — O30041 Twin pregnancy, dichorionic/diamniotic, first trimester: Secondary | ICD-10-CM

## 2020-12-29 DIAGNOSIS — R519 Headache, unspecified: Secondary | ICD-10-CM | POA: Diagnosis not present

## 2020-12-29 DIAGNOSIS — Z87891 Personal history of nicotine dependence: Secondary | ICD-10-CM | POA: Insufficient documentation

## 2020-12-29 DIAGNOSIS — O210 Mild hyperemesis gravidarum: Secondary | ICD-10-CM | POA: Diagnosis present

## 2020-12-29 DIAGNOSIS — Z3A08 8 weeks gestation of pregnancy: Secondary | ICD-10-CM | POA: Insufficient documentation

## 2020-12-29 DIAGNOSIS — O0990 Supervision of high risk pregnancy, unspecified, unspecified trimester: Secondary | ICD-10-CM

## 2020-12-29 HISTORY — DX: Headache, unspecified: R51.9

## 2020-12-29 LAB — COMPREHENSIVE METABOLIC PANEL
ALT: 22 U/L (ref 0–44)
AST: 22 U/L (ref 15–41)
Albumin: 4 g/dL (ref 3.5–5.0)
Alkaline Phosphatase: 42 U/L (ref 38–126)
Anion gap: 7 (ref 5–15)
BUN: 8 mg/dL (ref 6–20)
CO2: 24 mmol/L (ref 22–32)
Calcium: 9.3 mg/dL (ref 8.9–10.3)
Chloride: 103 mmol/L (ref 98–111)
Creatinine, Ser: 0.49 mg/dL (ref 0.44–1.00)
GFR, Estimated: >60 mL/min (ref >60)
Glucose, Bld: 97 mg/dL (ref 70–99)
Potassium: 3.7 mmol/L (ref 3.5–5.1)
Sodium: 134 mmol/L — ABNORMAL LOW (ref 135–145)
Total Bilirubin: 0.4 mg/dL (ref 0.3–1.2)
Total Protein: 7.4 g/dL (ref 6.5–8.1)

## 2020-12-29 LAB — CBC
HCT: 33.9 % — ABNORMAL LOW (ref 36.0–46.0)
Hemoglobin: 11.8 g/dL — ABNORMAL LOW (ref 12.0–15.0)
MCH: 31.6 pg (ref 26.0–34.0)
MCHC: 34.8 g/dL (ref 30.0–36.0)
MCV: 90.6 fL (ref 80.0–100.0)
Platelets: 351 10*3/uL (ref 150–400)
RBC: 3.74 MIL/uL — ABNORMAL LOW (ref 3.87–5.11)
RDW: 12 % (ref 11.5–15.5)
WBC: 10.8 10*3/uL — ABNORMAL HIGH (ref 4.0–10.5)
nRBC: 0 % (ref 0.0–0.2)

## 2020-12-29 LAB — URINALYSIS, COMPLETE (UACMP) WITH MICROSCOPIC
Bilirubin Urine: NEGATIVE
Glucose, UA: NEGATIVE mg/dL
Hgb urine dipstick: NEGATIVE
Ketones, ur: NEGATIVE mg/dL
Nitrite: NEGATIVE
Protein, ur: NEGATIVE mg/dL
Specific Gravity, Urine: 1.023 (ref 1.005–1.030)
pH: 6 (ref 5.0–8.0)

## 2020-12-29 LAB — POC URINE PREG, ED: Preg Test, Ur: POSITIVE — AB

## 2020-12-29 NOTE — ED Triage Notes (Signed)
Pt in with co vomiting for a few weeks, pt is [redacted] weeks pregnant. Has been given diclegis by OB states was working until today. No diarrhea or abd pain. Pt is co headache.

## 2020-12-30 ENCOUNTER — Other Ambulatory Visit: Payer: Self-pay

## 2020-12-30 ENCOUNTER — Encounter (HOSPITAL_COMMUNITY): Payer: Self-pay | Admitting: Obstetrics and Gynecology

## 2020-12-30 DIAGNOSIS — O30041 Twin pregnancy, dichorionic/diamniotic, first trimester: Secondary | ICD-10-CM | POA: Diagnosis present

## 2020-12-30 DIAGNOSIS — O210 Mild hyperemesis gravidarum: Secondary | ICD-10-CM

## 2020-12-30 DIAGNOSIS — Z3A08 8 weeks gestation of pregnancy: Secondary | ICD-10-CM | POA: Diagnosis not present

## 2020-12-30 DIAGNOSIS — Z87891 Personal history of nicotine dependence: Secondary | ICD-10-CM | POA: Diagnosis not present

## 2020-12-30 MED ORDER — LACTATED RINGERS IV BOLUS
1000.0000 mL | Freq: Once | INTRAVENOUS | Status: AC
  Administered 2020-12-30: 1000 mL via INTRAVENOUS

## 2020-12-30 MED ORDER — SCOPOLAMINE 1 MG/3DAYS TD PT72: 1.0000 | MEDICATED_PATCH | TRANSDERMAL | 1 refills | Status: DC

## 2020-12-30 MED ORDER — PROMETHAZINE HCL 25 MG PO TABS: 12.5000 mg | ORAL_TABLET | Freq: Four times a day (QID) | ORAL | 2 refills | Status: DC | PRN

## 2020-12-30 MED ORDER — PROMETHAZINE HCL 25 MG PO TABS: 12.5000 mg | ORAL_TABLET | Freq: Four times a day (QID) | ORAL | 1 refills | Status: DC | PRN

## 2020-12-30 MED ORDER — SCOPOLAMINE 1 MG/3DAYS TD PT72
1.0000 | MEDICATED_PATCH | TRANSDERMAL | Status: DC
  Administered 2020-12-30: 1.5 mg via TRANSDERMAL
  Filled 2020-12-30: qty 1

## 2020-12-30 MED ORDER — SODIUM CHLORIDE 0.9 % IV SOLN
25.0000 mg | Freq: Once | INTRAVENOUS | Status: AC
  Administered 2020-12-30: 25 mg via INTRAVENOUS
  Filled 2020-12-30: qty 1

## 2020-12-30 MED ORDER — FAMOTIDINE IN NACL 20-0.9 MG/50ML-% IV SOLN
20.0000 mg | Freq: Once | INTRAVENOUS | Status: AC
  Administered 2020-12-30: 20 mg via INTRAVENOUS
  Filled 2020-12-30: qty 50

## 2020-12-30 NOTE — Discharge Instructions (Signed)
Morning Sickness  Morning sickness is when you feel like you may vomit (feel nauseous) during pregnancy. Sometimes, you may vomit. Morning sickness most often happens in the morning, but it can also happen at any time of the day. Some women may have morning sickness that makes them vomit all the time. This is a more serious problem that needs treatment. What are the causes? The cause of this condition is not known. What increases the risk?  You had vomiting or a feeling like you may vomit before your pregnancy.  You had morning sickness in another pregnancy.  You are pregnant with more than one baby, such as twins. What are the signs or symptoms?  Feeling like you may vomit.  Vomiting. How is this treated? Treatment is usually not needed for this condition. You may only need to change what you eat. In some cases, your doctor may give you some things to take for your condition. These include:  Vitamin B6 supplements.  Medicines to treat the feeling that you may vomit.  Ginger. Follow these instructions at home: Medicines  Take over-the-counter and prescription medicines only as told by your doctor. Do not take any medicines until you talk with your doctor about them first.  Take multivitamins before you get pregnant. These can stop or lessen the symptoms of morning sickness. Eating and drinking  Eat dry toast or crackers before getting out of bed.  Eat 5 or 6 small meals a day.  Eat dry and bland foods like rice and baked potatoes.  Do not eat greasy, fatty, or spicy foods.  Have someone cook for you if the smell of food causes you to vomit or to feel like you may vomit.  If you feel like you may vomit after taking prenatal vitamins, take them at night or with a snack.  Eat protein foods when you need a snack. Nuts, yogurt, and cheese are good choices.  Drink fluids throughout the day.  Try ginger ale made with real ginger, ginger tea made from fresh grated ginger, or  ginger candies. General instructions  Do not smoke or use any products that contain nicotine or tobacco. If you need help quitting, ask your doctor.  Use an air purifier to keep the air in your house free of smells.  Get lots of fresh air.  Try to avoid smells that make you feel sick.  Try wearing an acupressure wristband. This is a wristband that is used to treat seasickness.  Try a treatment called acupuncture. In this treatment, a doctor puts needles into certain areas of your body to make you feel better. Contact a doctor if:  You need medicine to feel better.  You feel dizzy or light-headed.  You are losing weight. Get help right away if:  The feeling that you may vomit will not go away, or you cannot stop vomiting.  You faint.  You have very bad pain in your belly. Summary  Morning sickness is when you feel like you may vomit (feel nauseous) during pregnancy.  You may feel sick in the morning, but you can feel this way at any time of the day.  Making some changes to what you eat may help your symptoms go away. This information is not intended to replace advice given to you by your health care provider. Make sure you discuss any questions you have with your health care provider. Document Revised: 05/12/2020 Document Reviewed: 04/21/2020 Elsevier Patient Education  2021 [employer].  

## 2020-12-30 NOTE — MAU Note (Signed)
Pt reports every time she has been unable to keep anything down today, has a headache and in the last hour her lower stomach has been hurting.

## 2020-12-30 NOTE — MAU Provider Note (Signed)
History     CSN: [id number]  Arrival date and time: 12/29/20 2344   Event Date/Time   First Provider Initiated Contact with Patient 12/30/20 0133      Chief Complaint  Patient presents with  . Emesis  . Headache   24 y.o. G2P0010 @[redacted]w[redacted]d  with didi twins presenting with worsening N/V. Reports onset a few weeks ago but became worse yesterday. She was using Diclegis but its no longer helping. Denies sick contacts, fevers, or diarrhea. Denies abd pain and VB. Denies MJ use.    OB History    Gravida  2   Para  0   Term      Preterm      AB  1   Living  0     SAB  1   IAB      Ectopic      Multiple      Live Births  0           Past Medical History:  Diagnosis Date  . Headache   . No known health problems   . Ovarian cyst 2022   L side-ruptured    Past Surgical History:  Procedure Laterality Date  . DIAGNOSTIC LAPAROSCOPY WITH REMOVAL OF ECTOPIC PREGNANCY N/A 11/26/2020   Procedure: DIAGNOSTIC LAPAROSCOPY;  Surgeon: 11/28/2020, MD;  Location: [facility] ORS;  Service: Gynecology;  Laterality: N/A;    Family History  Problem Relation Age of Onset  . Diabetes Maternal Uncle     Social History   Tobacco Use  . Smoking status: Former Smoker    Quit date: 11/29/2020    Years since quitting: 0.0  . Smokeless tobacco: Never Used  Vaping Use  . Vaping Use: Never used  Substance Use Topics  . Alcohol use: Not Currently  . Drug use: Yes    Types: Marijuana    Comment: 11/29/2020    Allergies:  Allergies  Allergen Reactions  . Latex     Medications Prior to Admission  Medication Sig Dispense Refill Last Dose  . acetaminophen (TYLENOL) 325 MG tablet Take 325 mg by mouth every 6 (six) hours as needed.   12/29/2020 at 1400  . DICLEGIS 10-10 MG TBEC Take by mouth.   12/29/2020 at 1600  . Prenatal Vit-Fe Fumarate-FA (MULTIVITAMIN-PRENATAL) 27-0.8 MG TABS tablet Take 1 tablet by mouth daily at 12 noon.     . pyridOXINE (VITAMIN B-6) 50 MG tablet Take 1  tablet (50 mg total) by mouth daily. (Patient not taking: Reported on 12/24/2020) 30 tablet 1   . [DISCONTINUED] promethazine (PHENERGAN) 25 MG tablet Take 1 tablet (25 mg total) by mouth every 6 (six) hours as needed for nausea or vomiting. (Patient not taking: Reported on 12/24/2020) 30 tablet 2     Review of Systems  Constitutional: Negative for fever.  Gastrointestinal: Positive for nausea and vomiting. Negative for abdominal pain.  Genitourinary: Negative for vaginal bleeding.  Neurological: Positive for headaches.   Physical Exam   Blood pressure 113/62, pulse 86, temperature 98.4 F (36.9 C), temperature source Oral, resp. rate 16, height 5\' 6"  (1.676 m), weight 60.8 kg, last menstrual period 10/29/2020, SpO2 100 %.  Physical Exam Vitals and nursing note reviewed.  Constitutional:      Appearance: Normal appearance.  HENT:     Head: Normocephalic and atraumatic.  Cardiovascular:     Rate and Rhythm: Normal rate.  Pulmonary:     Effort: Pulmonary effort is normal. No respiratory distress.  Abdominal:  General: There is no distension.     Palpations: Abdomen is soft. There is no mass.     Tenderness: There is no abdominal tenderness. There is no guarding or rebound.     Hernia: No hernia is present.  Musculoskeletal:        General: Normal range of motion.     Cervical back: Normal range of motion.  Skin:    General: Skin is warm and dry.  Neurological:     General: No focal deficit present.     Mental Status: She is alert and oriented to person, place, and time.  Psychiatric:        Mood and Affect: Mood normal.        Behavior: Behavior normal.    Results for orders placed or performed during the hospital encounter of 12/29/20 (from the past 24 hour(s))  CBC     Status: Abnormal   Collection Time: 12/29/20  9:43 PM  Result Value Ref Range   WBC 10.8 (H) 4.0 - 10.5 K/uL   RBC 3.74 (L) 3.87 - 5.11 MIL/uL   Hemoglobin 11.8 (L) 12.0 - 15.0 g/dL   HCT 23.7 (L)  62.8 - 46.0 %   MCV 90.6 80.0 - 100.0 fL   MCH 31.6 26.0 - 34.0 pg   MCHC 34.8 30.0 - 36.0 g/dL   RDW 31.5 17.6 - 16.0 %   Platelets 351 150 - 400 K/uL   nRBC 0.0 0.0 - 0.2 %  Comprehensive metabolic panel     Status: Abnormal   Collection Time: 12/29/20  9:43 PM  Result Value Ref Range   Sodium 134 (L) 135 - 145 mmol/L   Potassium 3.7 3.5 - 5.1 mmol/L   Chloride 103 98 - 111 mmol/L   CO2 24 22 - 32 mmol/L   Glucose, Bld 97 70 - 99 mg/dL   BUN 8 6 - 20 mg/dL   Creatinine, Ser 7.37 0.44 - 1.00 mg/dL   Calcium 9.3 8.9 - 10.6 mg/dL   Total Protein 7.4 6.5 - 8.1 g/dL   Albumin 4.0 3.5 - 5.0 g/dL   AST 22 15 - 41 U/L   ALT 22 0 - 44 U/L   Alkaline Phosphatase 42 38 - 126 U/L   Total Bilirubin 0.4 0.3 - 1.2 mg/dL   GFR, Estimated >26 >94 mL/min   Anion gap 7 5 - 15  Urinalysis, Complete w Microscopic Urine, Clean Catch     Status: Abnormal   Collection Time: 12/29/20  9:43 PM  Result Value Ref Range   Color, Urine YELLOW (A) YELLOW   APPearance HAZY (A) CLEAR   Specific Gravity, Urine 1.023 1.005 - 1.030   pH 6.0 5.0 - 8.0   Glucose, UA NEGATIVE NEGATIVE mg/dL   Hgb urine dipstick NEGATIVE NEGATIVE   Bilirubin Urine NEGATIVE NEGATIVE   Ketones, ur NEGATIVE NEGATIVE mg/dL   Protein, ur NEGATIVE NEGATIVE mg/dL   Nitrite NEGATIVE NEGATIVE   Leukocytes,Ua TRACE (A) NEGATIVE   RBC / HPF 0-5 0 - 5 RBC/hpf   WBC, UA 0-5 0 - 5 WBC/hpf   Bacteria, UA MANY (A) NONE SEEN   Squamous Epithelial / LPF 0-5 0 - 5   Mucus PRESENT   POC Urine Pregnancy, ED     Status: Abnormal   Collection Time: 12/29/20  9:48 PM  Result Value Ref Range   Preg Test, Ur POSITIVE (A) NEGATIVE   MAU Course  Procedures LR Pepcid Phenergan Scopolamine  MDM Labs reviewed. Feeling  better after fluids and meds. No emesis while here. Tolerating po. Stable for discharge home.   Assessment and Plan   1. Dichorionic diamniotic twin pregnancy in first trimester   2. Supervision of high risk pregnancy,  antepartum   3. Morning sickness    Discharge home Follow up at [facility] as scheduled Rx Scopolamine Rx Phenergan Return precautions  Allergies as of 12/30/2020      Reactions   Latex       Medication List    STOP taking these medications   pyridOXINE 50 MG tablet Commonly known as: VITAMIN B-6     TAKE these medications   acetaminophen 325 MG tablet Commonly known as: TYLENOL Take 325 mg by mouth every 6 (six) hours as needed.   Diclegis 10-10 MG Tbec Generic drug: Doxylamine-Pyridoxine Take by mouth.   multivitamin-prenatal 27-0.8 MG Tabs tablet Take 1 tablet by mouth daily at 12 noon.   promethazine 25 MG tablet Commonly known as: PHENERGAN Take 0.5-1 tablets (12.5-25 mg total) by mouth every 6 (six) hours as needed for nausea or vomiting. What changed: how much to take   scopolamine 1 MG/3DAYS Commonly known as: TRANSDERM-SCOP Place 1 patch (1.5 mg total) onto the skin every 3 (three) days. Start taking on: January 02, 2021      Donette Larry, [place] 12/30/2020, 4:20 AM

## 2020-12-31 LAB — CULTURE, OB URINE: Culture: NO GROWTH

## 2021-01-22 ENCOUNTER — Encounter: Payer: Self-pay | Admitting: Obstetrics and Gynecology

## 2021-01-22 ENCOUNTER — Other Ambulatory Visit: Payer: Self-pay

## 2021-01-22 ENCOUNTER — Ambulatory Visit (INDEPENDENT_AMBULATORY_CARE_PROVIDER_SITE_OTHER): Payer: Medicaid Other | Admitting: Obstetrics and Gynecology

## 2021-01-22 VITALS — BP 122/70 | Wt 141.0 lb

## 2021-01-22 DIAGNOSIS — O30041 Twin pregnancy, dichorionic/diamniotic, first trimester: Secondary | ICD-10-CM

## 2021-01-22 DIAGNOSIS — Z3A12 12 weeks gestation of pregnancy: Secondary | ICD-10-CM

## 2021-01-22 DIAGNOSIS — O0991 Supervision of high risk pregnancy, unspecified, first trimester: Secondary | ICD-10-CM

## 2021-01-22 DIAGNOSIS — Z1379 Encounter for other screening for genetic and chromosomal anomalies: Secondary | ICD-10-CM

## 2021-01-22 NOTE — Progress Notes (Signed)
  Routine Prenatal Care Visit  Subjective  Kendra Ward is a 25 y.o. G2P0010 at [redacted]w[redacted]d being seen today for ongoing prenatal care.  She is currently monitored for the following issues for this high-risk pregnancy and has LLQ pain; Left ovarian cyst; Dichorionic diamniotic twin pregnancy in first trimester; and Supervision of high risk pregnancy, antepartum on their problem list.  ----------------------------------------------------------------------------------- Patient reports no complaints.    . Vag. Bleeding: None.  Movement: Absent. Leaking Fluid denies.  Went to MAU after last visit (a few days) for nausea. Feeling much better now ----------------------------------------------------------------------------------- The following portions of the patient's history were reviewed and updated as appropriate: allergies, current medications, past family history, past medical history, past social history, past surgical history and problem list. Problem list updated.  Objective  Blood pressure 122/70, weight 141 lb (64 kg), last menstrual period 10/29/2020. Pregravid weight 150 lb (68 kg) Total Weight Gain -9 lb (-4.082 kg) Urinalysis: Urine Protein    Urine Glucose    Fetal Status: Fetal Heart Rate (bpm): 163/168   Movement: Absent    FHR x 2 by bedside u/s   General:  Alert, oriented and cooperative. Patient is in no acute distress.  Skin: Skin is warm and dry. No rash noted.   Cardiovascular: Normal heart rate noted  Respiratory: Normal respiratory effort, no problems with respiration noted  Abdomen: Soft, gravid, appropriate for gestational age.       Pelvic:  Cervical exam deferred        Extremities: Normal range of motion.     Mental Status: Normal mood and affect. Normal behavior. Normal judgment and thought content.   Assessment   25 y.o. G2P0010 at [redacted]w[redacted]d by  08/05/2021, by Last Menstrual Period presenting for routine prenatal visit  Plan   March 2022 Problems (from  12/17/20 to present)    Problem Noted Resolved   Supervision of high risk pregnancy, antepartum 12/24/2020 by Conard Novak, MD No   Overview Signed 01/22/2021 11:24 AM by Conard Novak, MD    Clinic [facility] Prenatal Labs  Dating L=8 Blood type: --/--/O POS (02/15 2334)   Genetic Screen NIPS: desires Antibody:NEG (02/15 2334)  Anatomic [place]  Rubella:   Varicella: @VZVIGG @  GTT Third trimester:  RPR:     Rhogam  HBsAg:     TDaP vaccine                       Flu Shot: HIV:     Baby Food                                GBS:   Contraception  Pap: NILM 12/2020  CBB     CS/VBAC    Support Person                Preterm labor symptoms and general obstetric precautions including but not limited to vaginal bleeding, contractions, leaking of fluid and fetal movement were reviewed in detail with the patient. Please refer to After Visit Summary for other counseling recommendations.   Return in about 4 weeks (around 02/19/2021) for Routine Prenatal Appointment.   04/21/2021, MD, [facility], [facility] 01/22/2021 11:25 AM

## 2021-01-23 LAB — RPR+RH+ABO+RUB AB+AB SCR+CB...
Antibody Screen: NEGATIVE
HIV Screen 4th Generation wRfx: NONREACTIVE
Hematocrit: 33.3 % — ABNORMAL LOW (ref 34.0–46.6)
Hemoglobin: 11.2 g/dL (ref 11.1–15.9)
Hepatitis B Surface Ag: NEGATIVE
MCH: 31.7 pg (ref 26.6–33.0)
MCHC: 33.6 g/dL (ref 31.5–35.7)
MCV: 94 fL (ref 79–97)
Platelets: 365 10*3/uL (ref 150–450)
RBC: 3.53 x10E6/uL — ABNORMAL LOW (ref 3.77–5.28)
RDW: 12.2 % (ref 11.7–15.4)
RPR Ser Ql: NONREACTIVE
Rh Factor: POSITIVE
Rubella Antibodies, IGG: 5.03 index (ref >0.99)
Varicella zoster IgG: 595 index (ref >165)
WBC: 13 10*3/uL — ABNORMAL HIGH (ref 3.4–10.8)

## 2021-01-27 LAB — MATERNIT 21 PLUS CORE, BLOOD
Fetal Fraction: 8
Result (T21): NEGATIVE
Trisomy 13 (Patau syndrome): NEGATIVE
Trisomy 18 (Edwards syndrome): NEGATIVE
Trisomy 21 (Down syndrome): NEGATIVE

## 2021-02-04 ENCOUNTER — Emergency Department
Admission: EM | Admit: 2021-02-04 | Discharge: 2021-02-04 | Disposition: A | Discharge status: Home / Self Care | Payer: Medicaid Other | Attending: Emergency Medicine | Admitting: Emergency Medicine

## 2021-02-04 ENCOUNTER — Emergency Department: Payer: Medicaid Other

## 2021-02-04 ENCOUNTER — Other Ambulatory Visit: Payer: Self-pay

## 2021-02-04 ENCOUNTER — Telehealth: Payer: Self-pay

## 2021-02-04 ENCOUNTER — Encounter: Payer: Self-pay | Admitting: Emergency Medicine

## 2021-02-04 DIAGNOSIS — Z87891 Personal history of nicotine dependence: Secondary | ICD-10-CM | POA: Insufficient documentation

## 2021-02-04 DIAGNOSIS — O210 Mild hyperemesis gravidarum: Secondary | ICD-10-CM | POA: Insufficient documentation

## 2021-02-04 DIAGNOSIS — R000 Tachycardia, unspecified: Secondary | ICD-10-CM | POA: Insufficient documentation

## 2021-02-04 DIAGNOSIS — R109 Unspecified abdominal pain: Secondary | ICD-10-CM

## 2021-02-04 DIAGNOSIS — O99352 Diseases of the nervous system complicating pregnancy, second trimester: Secondary | ICD-10-CM | POA: Insufficient documentation

## 2021-02-04 DIAGNOSIS — Z20822 Contact with and (suspected) exposure to covid-19: Secondary | ICD-10-CM | POA: Diagnosis not present

## 2021-02-04 DIAGNOSIS — Z91040 Latex allergy status: Secondary | ICD-10-CM | POA: Insufficient documentation

## 2021-02-04 DIAGNOSIS — R8271 Bacteriuria: Secondary | ICD-10-CM

## 2021-02-04 DIAGNOSIS — G43809 Other migraine, not intractable, without status migrainosus: Secondary | ICD-10-CM | POA: Diagnosis not present

## 2021-02-04 DIAGNOSIS — O2392 Unspecified genitourinary tract infection in pregnancy, second trimester: Secondary | ICD-10-CM | POA: Diagnosis not present

## 2021-02-04 DIAGNOSIS — O99412 Diseases of the circulatory system complicating pregnancy, second trimester: Secondary | ICD-10-CM | POA: Insufficient documentation

## 2021-02-04 DIAGNOSIS — R1110 Vomiting, unspecified: Secondary | ICD-10-CM

## 2021-02-04 DIAGNOSIS — Z3A14 14 weeks gestation of pregnancy: Secondary | ICD-10-CM | POA: Insufficient documentation

## 2021-02-04 LAB — COMPREHENSIVE METABOLIC PANEL
ALT: 50 U/L — ABNORMAL HIGH (ref 0–44)
AST: 32 U/L (ref 15–41)
Albumin: 3.4 g/dL — ABNORMAL LOW (ref 3.5–5.0)
Alkaline Phosphatase: 56 U/L (ref 38–126)
Anion gap: 8 (ref 5–15)
BUN: 9 mg/dL (ref 6–20)
CO2: 22 mmol/L (ref 22–32)
Calcium: 8.9 mg/dL (ref 8.9–10.3)
Chloride: 103 mmol/L (ref 98–111)
Creatinine, Ser: 0.5 mg/dL (ref 0.44–1.00)
GFR, Estimated: >60 mL/min (ref >60)
Glucose, Bld: 88 mg/dL (ref 70–99)
Potassium: 3.7 mmol/L (ref 3.5–5.1)
Sodium: 133 mmol/L — ABNORMAL LOW (ref 135–145)
Total Bilirubin: 0.5 mg/dL (ref 0.3–1.2)
Total Protein: 7.1 g/dL (ref 6.5–8.1)

## 2021-02-04 LAB — URINALYSIS, COMPLETE (UACMP) WITH MICROSCOPIC
Bilirubin Urine: NEGATIVE
Glucose, UA: NEGATIVE mg/dL
Hgb urine dipstick: NEGATIVE
Ketones, ur: 5 mg/dL — AB
Leukocytes,Ua: NEGATIVE
Nitrite: NEGATIVE
Protein, ur: 30 mg/dL — AB
Specific Gravity, Urine: 1.024 (ref 1.005–1.030)
pH: 5 (ref 5.0–8.0)

## 2021-02-04 LAB — CBC WITH DIFFERENTIAL/PLATELET
Abs Immature Granulocytes: 0.17 10*3/uL — ABNORMAL HIGH (ref 0.00–0.07)
Basophils Absolute: 0 10*3/uL (ref 0.0–0.1)
Basophils Relative: 0 %
Eosinophils Absolute: 0.4 10*3/uL (ref 0.0–0.5)
Eosinophils Relative: 3 %
HCT: 32 % — ABNORMAL LOW (ref 36.0–46.0)
Hemoglobin: 11.3 g/dL — ABNORMAL LOW (ref 12.0–15.0)
Immature Granulocytes: 1 %
Lymphocytes Relative: 6 %
Lymphs Abs: 0.8 10*3/uL (ref 0.7–4.0)
MCH: 32.4 pg (ref 26.0–34.0)
MCHC: 35.3 g/dL (ref 30.0–36.0)
MCV: 91.7 fL (ref 80.0–100.0)
Monocytes Absolute: 1 10*3/uL (ref 0.1–1.0)
Monocytes Relative: 7 %
Neutro Abs: 11.8 10*3/uL — ABNORMAL HIGH (ref 1.7–7.7)
Neutrophils Relative %: 83 %
Platelets: 351 10*3/uL (ref 150–400)
RBC: 3.49 MIL/uL — ABNORMAL LOW (ref 3.87–5.11)
RDW: 13 % (ref 11.5–15.5)
WBC: 14.1 10*3/uL — ABNORMAL HIGH (ref 4.0–10.5)
nRBC: 0 % (ref 0.0–0.2)

## 2021-02-04 LAB — HCG, QUANTITATIVE, PREGNANCY: hCG, Beta Chain, Quant, S: 171978 m[IU]/mL — ABNORMAL HIGH (ref <5)

## 2021-02-04 LAB — POC URINE PREG, ED: Preg Test, Ur: POSITIVE — AB

## 2021-02-04 LAB — RESP PANEL BY RT-PCR (FLU A&B, COVID) ARPGX2
Influenza A by PCR: NEGATIVE
Influenza B by PCR: NEGATIVE
SARS Coronavirus 2 by RT PCR: NEGATIVE

## 2021-02-04 LAB — LIPASE, BLOOD: Lipase: 26 U/L (ref 11–51)

## 2021-02-04 MED ORDER — LACTATED RINGERS IV BOLUS
1000.0000 mL | Freq: Once | INTRAVENOUS | Status: AC
  Administered 2021-02-04: 1000 mL via INTRAVENOUS

## 2021-02-04 MED ORDER — DIPHENHYDRAMINE HCL 50 MG/ML IJ SOLN
25.0000 mg | Freq: Once | INTRAMUSCULAR | Status: AC
  Administered 2021-02-04: 25 mg via INTRAVENOUS
  Filled 2021-02-04: qty 1

## 2021-02-04 MED ORDER — PYRIDOXINE HCL 100 MG/ML IJ SOLN
100.0000 mg | Freq: Once | INTRAMUSCULAR | Status: AC
  Administered 2021-02-04: 100 mg via INTRAVENOUS
  Filled 2021-02-04: qty 1

## 2021-02-04 MED ORDER — ONDANSETRON HCL 4 MG/2ML IJ SOLN
4.0000 mg | Freq: Once | INTRAMUSCULAR | Status: AC
  Administered 2021-02-04: 4 mg via INTRAVENOUS
  Filled 2021-02-04: qty 2

## 2021-02-04 MED ORDER — CEPHALEXIN 500 MG PO CAPS: 500.0000 mg | ORAL_CAPSULE | Freq: Four times a day (QID) | ORAL | 0 refills | Status: AC

## 2021-02-04 MED ORDER — PROCHLORPERAZINE EDISYLATE 10 MG/2ML IJ SOLN
10.0000 mg | Freq: Once | INTRAMUSCULAR | Status: AC
  Administered 2021-02-04: 10 mg via INTRAVENOUS
  Filled 2021-02-04: qty 2

## 2021-02-04 NOTE — ED Triage Notes (Signed)
Pt c/o HA and emesis since yesterday, also c/o light sensitivity at this time. Pt A&O x4, NAD noted at this time.   Pt also states is currently [redacted] weeks pregnant at this time.

## 2021-02-04 NOTE — ED Notes (Signed)
Pt given ice.  

## 2021-02-04 NOTE — Telephone Encounter (Signed)
Pt calling; needs to know what to do as she can't keep anything down since last night; nausea med isn't helping.  [phone]  Pt denies fever; adv may be a stomach bug since she hasn't been vomiting lately until last night.  Adv to let system rest:  Sip clear liquids for 24hrs; then bland food for 24hrs then work up to reg diet.  Pt aware if unable to keep liquids down to be seen either here of ED if we are not open.

## 2021-02-04 NOTE — ED Notes (Signed)
EDP at bedside and MSE completed prior to this RN arrival at bedside.

## 2021-02-04 NOTE — ED Provider Notes (Signed)
[facility] Emergency Department Provider Note  ____________________________________________   Event Date/Time   First MD Initiated Contact with Patient 02/04/21 1734     (approximate)  I have reviewed the triage vital signs and the nursing notes.   HISTORY  Chief Complaint Headache   HPI Kendra Ward is a 25 y.o. female G2P0010 approximately [redacted] weeks along with a Dichorionic diamniotic twin pregnancy and with a past medical history of ovarian cyst and headaches who presents for assessment of some headache left lower back soreness and epigastric discomfort as well as some nonbloody nonbilious vomiting and loose stool this morning.  Trickle active last night.  Symptoms all symptoms started last night.  She states she took some promethazine but this did not significantly help.  She denies any earache, sore throat, eye pain, chest pain, cough, shortness of breath, lower abdominal pain, abnormal vaginal bleeding or discharge, urinary symptoms, right-sided back pain, rash or extremity pain.  No recent injuries or falls.  No other acute concerns at this time.  She denies any significant NSAID use, EtOH use or illicit drug use.         Past Medical History:  Diagnosis Date  . Headache   . No known health problems   . Ovarian cyst 2022   L side-ruptured    Patient Active Problem List   Diagnosis Date Noted  . Supervision of high risk pregnancy, antepartum 12/24/2020  . Dichorionic diamniotic twin pregnancy in first trimester 12/08/2020  . LLQ pain   . Left ovarian cyst     Past Surgical History:  Procedure Laterality Date  . DIAGNOSTIC LAPAROSCOPY WITH REMOVAL OF ECTOPIC PREGNANCY N/A 11/26/2020   Procedure: DIAGNOSTIC LAPAROSCOPY;  Surgeon: Nadara Mustard, MD;  Location: [facility] ORS;  Service: Gynecology;  Laterality: N/A;    Prior to Admission medications   Medication Sig Start Date End Date Taking? Authorizing Provider  cephALEXin (KEFLEX) 500  MG capsule Take 1 capsule (500 mg total) by mouth 4 (four) times daily for 5 days. 02/04/21 02/09/21 Yes Gilles Chiquito, MD  acetaminophen (TYLENOL) 325 MG tablet Take 325 mg by mouth every 6 (six) hours as needed.    [provider]  DICLEGIS 10-10 MG TBEC Take by mouth. 12/16/20   [provider]  Prenatal Vit-Fe Fumarate-FA (MULTIVITAMIN-PRENATAL) 27-0.8 MG TABS tablet Take 1 tablet by mouth daily at 12 noon.    [provider]  promethazine (PHENERGAN) 25 MG tablet Take 0.5-1 tablets (12.5-25 mg total) by mouth every 6 (six) hours as needed for nausea or vomiting. 12/30/20   Donette Larry, CNM  scopolamine (TRANSDERM-SCOP) 1 MG/3DAYS Place 1 patch (1.5 mg total) onto the skin every 3 (three) days. 01/02/21   Donette Larry, CNM    Allergies Latex  Family History  Problem Relation Age of Onset  . Diabetes Maternal Uncle     Social History Social History   Tobacco Use  . Smoking status: Former Smoker    Quit date: 11/29/2020    Years since quitting: 0.1  . Smokeless tobacco: Never Used  Vaping Use  . Vaping Use: Never used  Substance Use Topics  . Alcohol use: Not Currently  . Drug use: Yes    Types: Marijuana    Comment: 11/29/2020    Review of Systems  Review of Systems  Constitutional: Negative for chills and fever.  HENT: Negative for sore throat.   Eyes: Negative for pain.  Respiratory: Negative for cough and stridor.   Cardiovascular:  Negative for chest pain.  Gastrointestinal: Positive for abdominal pain, nausea and vomiting.  Musculoskeletal: Positive for back pain ( L lower ).  Skin: Negative for rash.  Neurological: Positive for headaches. Negative for seizures and loss of consciousness.  Psychiatric/Behavioral: Negative for suicidal ideas.  All other systems reviewed and are negative.     ____________________________________________   PHYSICAL EXAM:  VITAL SIGNS: ED Triage Vitals  Enc Vitals Group     BP      Pulse       Resp      Temp      Temp src      SpO2      Weight      Height      Head Circumference      Peak Flow      Pain Score      Pain Loc      Pain Edu?      Excl. in GC?    Vitals:   02/04/21 2030 02/04/21 2126  BP: 117/73   Pulse: (!) 104   Resp: 18   Temp:  98.2 F (36.8 C)  SpO2: 100%    Physical Exam Vitals and nursing note reviewed.  Constitutional:      General: She is not in acute distress.    Appearance: She is well-developed.  HENT:     Head: Normocephalic and atraumatic.     Right Ear: External ear normal.     Left Ear: External ear normal.     Mouth/Throat:     Mouth: Mucous membranes are dry.  Eyes:     Conjunctiva/sclera: Conjunctivae normal.  Cardiovascular:     Rate and Rhythm: Regular rhythm. Tachycardia present.     Heart sounds: No murmur heard.   Pulmonary:     Effort: Pulmonary effort is normal. No respiratory distress.     Breath sounds: Normal breath sounds.  Abdominal:     Palpations: Abdomen is soft.     Tenderness: There is no abdominal tenderness. There is no right CVA tenderness or left CVA tenderness.  Musculoskeletal:     Cervical back: Neck supple.  Skin:    General: Skin is warm and dry.     Capillary Refill: Capillary refill takes 2 to 3 seconds.  Neurological:     Mental Status: She is alert and oriented to person, place, and time.  Psychiatric:        Mood and Affect: Mood normal.     Mild tenderness in the epigastrium without tenderness or Murphy sign right upper quadrant or any tenderness in the right lower quadrant left lower quadrant.  No significant CVA tenderness. ____________________________________________   LABS (all labs ordered are listed, but only abnormal results are displayed)  Labs Reviewed  CBC WITH DIFFERENTIAL/PLATELET - Abnormal; Notable for the following components:      Result Value   WBC 14.1 (*)    RBC 3.49 (*)    Hemoglobin 11.3 (*)    HCT 32.0 (*)    Neutro Abs 11.8 (*)    Abs Immature  Granulocytes 0.17 (*)    All other components within normal limits  COMPREHENSIVE METABOLIC PANEL - Abnormal; Notable for the following components:   Sodium 133 (*)    Albumin 3.4 (*)    ALT 50 (*)    All other components within normal limits  URINALYSIS, COMPLETE (UACMP) WITH MICROSCOPIC - Abnormal; Notable for the following components:   Color, Urine YELLOW (*)    APPearance HAZY (*)  Ketones, ur 5 (*)    Protein, ur 30 (*)    Bacteria, UA RARE (*)    All other components within normal limits  HCG, QUANTITATIVE, PREGNANCY - Abnormal; Notable for the following components:   hCG, Beta Chain, Quant, S 171,978 (*)    All other components within normal limits  POC URINE PREG, ED - Abnormal; Notable for the following components:   Preg Test, Ur Positive (*)    All other components within normal limits  RESP PANEL BY RT-PCR (FLU A&B, COVID) ARPGX2  LIPASE, BLOOD   ____________________________________________  EKG  ____________________________________________  RADIOLOGY  ED MD interpretation: Live twin intrauterine pregnancy without any acute complications no evidence of torsion or other acute abnormality.  Official radiology report(s): US OB Limited  Result Date: 02/04/2021 CLINICAL DATA:  Pregnant patient at [redacted] weeks gestation with abdominal pain. EXAM: LIMITED OBSTETRIC ULTRASOUND AND DOPPLER FINDINGS: Number of Fetuses:  2 Separating Membrane: Visualized TWIN 1 Heart Rate:  164 bpm Movement: Yes Presentation: Transverse, head maternal right. Placental Location: Posterior Previa: No Amniotic Fluid (Subjective):  Within normal limits. BPD:  2.76cm 14w 6d TWIN 2 Heart Rate:  155 bpm Movement: Yes Presentation: Transverse, head maternal right. Placental Location: Fundal Previa: No Amniotic Fluid (Subjective): Within normal limits. BPD:  2.70cm 14w 5d MATERNAL FINDINGS: Cervix:  Not discretely imaged. Uterus/Adnexae: Right ovary measures 3.3 x 1.4 x 2.5 cm and appears normal. Normal  blood flow. Left ovary measures 6.0 x 3.3 x 4.5 cm and contains a 4.1 cm simple cyst. Blood flow is seen to the ovarian parenchyma. No adnexal mass or pelvic free fluid. Pulsed Doppler evaluation of both ovaries demonstrates normal low-resistance arterial and venous waveforms. IMPRESSION: 1. Live twin intrauterine gestation. Estimated gestational age of twin A 14 weeks 6 days, estimated gestational age of twin B 14 weeks 5 days. 2. Normal blood flow to both ovaries. 4.1 simple cyst in the left ovary. No torsion. This exam is performed on an emergent basis and does not comprehensively evaluate fetal size, dating, or anatomy; follow-up complete OB US should be considered if further fetal assessment is warranted Electronically Signed   By: Narda RutherfordMelanie  Sanford M.D.   On: 02/04/2021 19:21   US OB Limited  Result Date: 02/04/2021 CLINICAL DATA:  Pregnant patient at [redacted] weeks gestation with abdominal pain. EXAM: LIMITED OBSTETRIC ULTRASOUND AND DOPPLER FINDINGS: Number of Fetuses:  2 Separating Membrane: Visualized TWIN 1 Heart Rate:  164 bpm Movement: Yes Presentation: Transverse, head maternal right. Placental Location: Posterior Previa: No Amniotic Fluid (Subjective):  Within normal limits. BPD:  2.76cm 14w 6d TWIN 2 Heart Rate:  155 bpm Movement: Yes Presentation: Transverse, head maternal right. Placental Location: Fundal Previa: No Amniotic Fluid (Subjective): Within normal limits. BPD:  2.70cm 14w 5d MATERNAL FINDINGS: Cervix:  Not discretely imaged. Uterus/Adnexae: Right ovary measures 3.3 x 1.4 x 2.5 cm and appears normal. Normal blood flow. Left ovary measures 6.0 x 3.3 x 4.5 cm and contains a 4.1 cm simple cyst. Blood flow is seen to the ovarian parenchyma. No adnexal mass or pelvic free fluid. Pulsed Doppler evaluation of both ovaries demonstrates normal low-resistance arterial and venous waveforms. IMPRESSION: 1. Live twin intrauterine gestation. Estimated gestational age of twin A 14 weeks 6 days, estimated  gestational age of twin B 14 weeks 5 days. 2. Normal blood flow to both ovaries. 4.1 simple cyst in the left ovary. No torsion. This exam is performed on an emergent basis and does not comprehensively evaluate fetal  size, dating, or anatomy; follow-up complete OB US should be considered if further fetal assessment is warranted Electronically Signed   By: Narda Rutherford M.D.   On: 02/04/2021 19:21   US ABDOMINAL PELVIC ART/VENT FLOW DOPPLER  Result Date: 02/04/2021 CLINICAL DATA:  Pregnant patient at [redacted] weeks gestation with abdominal pain. EXAM: LIMITED OBSTETRIC ULTRASOUND AND DOPPLER FINDINGS: Number of Fetuses:  2 Separating Membrane: Visualized TWIN 1 Heart Rate:  164 bpm Movement: Yes Presentation: Transverse, head maternal right. Placental Location: Posterior Previa: No Amniotic Fluid (Subjective):  Within normal limits. BPD:  2.76cm 14w 6d TWIN 2 Heart Rate:  155 bpm Movement: Yes Presentation: Transverse, head maternal right. Placental Location: Fundal Previa: No Amniotic Fluid (Subjective): Within normal limits. BPD:  2.70cm 14w 5d MATERNAL FINDINGS: Cervix:  Not discretely imaged. Uterus/Adnexae: Right ovary measures 3.3 x 1.4 x 2.5 cm and appears normal. Normal blood flow. Left ovary measures 6.0 x 3.3 x 4.5 cm and contains a 4.1 cm simple cyst. Blood flow is seen to the ovarian parenchyma. No adnexal mass or pelvic free fluid. Pulsed Doppler evaluation of both ovaries demonstrates normal low-resistance arterial and venous waveforms. IMPRESSION: 1. Live twin intrauterine gestation. Estimated gestational age of twin A 14 weeks 6 days, estimated gestational age of twin B 14 weeks 5 days. 2. Normal blood flow to both ovaries. 4.1 simple cyst in the left ovary. No torsion. This exam is performed on an emergent basis and does not comprehensively evaluate fetal size, dating, or anatomy; follow-up complete OB US should be considered if further fetal assessment is warranted Electronically Signed   By:  Narda Rutherford M.D.   On: 02/04/2021 19:21    ____________________________________________   PROCEDURES  Procedure(s) performed (including Critical Care):  .1-3 Lead EKG Interpretation Performed by: Gilles Chiquito, MD Authorized by: Gilles Chiquito, MD     Interpretation: normal     ECG rate assessment: normal     Rhythm: sinus rhythm     Ectopy: none     Conduction: normal       ____________________________________________   INITIAL IMPRESSION / ASSESSMENT AND PLAN / ED COURSE      Patient presents with above-stated history exam for above noted consolation of symptoms.  On arrival she is slightly tachycardic with otherwise stable vital signs on room air.  Differential includes possible acute infectious gastroenteritis, cystitis, pyelonephritis, torsion, complication of pregnancy, kidney stone, dehydration and metabolic derangements possible pancreatitis.  No tenderness in the right upper quadrant or findings on CMP to suggest cholecystitis.  No right lower quadrant tenderness to suggest appendicitis.  Suspect headache is likely secondary to her vomiting mild dehydration versus migraine as she has history of migraines.  She is not meningitic and is nonfocal neuro exam and no fever over the suspicion for meningitis.  To correct her headache she was treated with migraine cocktail and on reassessment stated her headache is much improved.  Ultrasound obtained shows twins at appropriate age without any evidence of hemorrhage torsion or other acute abnormality.  CBC shows leukocytosis which is somewhat nonspecific but no significant acute anemia.  CMP shows no significant electrode or metabolic derangements.  UA shows some rare bacteria but otherwise no blood or evidence of infection and overall a lower suspicion for stone or cystitis or pyelonephritis at this time.  However will treat with Keflex for asymptomatic bacteriuria.  Lipase not consistent pancreatitis.  COVID is negative.   Suspect likely hyperemesis related to pregnancy versus some gastritis.  Given  improvement in nausea and patient tolerating p.o. after blood noted in the medics I think she is safe for discharge with plan for outpatient OB follow-up.  Discharged stable condition.  Strict return precautions advised and discussed   ____________________________________________   FINAL CLINICAL IMPRESSION(S) / ED DIAGNOSES  Final diagnoses:  Other migraine without status migrainosus, not intractable  Hyperemesis  Asymptomatic bacteriuria    Medications  lactated ringers bolus 1,000 mL (0 mLs Intravenous Stopped 02/04/21 2036)  ondansetron (ZOFRAN) injection 4 mg (4 mg Intravenous Given 02/04/21 1915)  pyridOXINE (B-6) injection 100 mg (100 mg Intravenous Given 02/04/21 1915)  prochlorperazine (COMPAZINE) injection 10 mg (10 mg Intravenous Given 02/04/21 2036)  diphenhydrAMINE (BENADRYL) injection 25 mg (25 mg Intravenous Given 02/04/21 2035)     ED Discharge Orders         Ordered    cephALEXin (KEFLEX) 500 MG capsule  4 times daily        02/04/21 2107           Note:  This document was prepared using Dragon voice recognition software and may include unintentional dictation errors.   Gilles Chiquito, MD 02/04/21 [phone]

## 2021-02-11 NOTE — Telephone Encounter (Signed)
Pt has been seen since this msg was recv'd.

## 2021-02-18 ENCOUNTER — Ambulatory Visit (INDEPENDENT_AMBULATORY_CARE_PROVIDER_SITE_OTHER): Payer: Medicaid Other | Admitting: Obstetrics and Gynecology

## 2021-02-18 ENCOUNTER — Other Ambulatory Visit: Payer: Self-pay

## 2021-02-18 ENCOUNTER — Encounter: Payer: Self-pay | Admitting: Obstetrics and Gynecology

## 2021-02-18 VITALS — BP 120/78 | Wt 151.0 lb

## 2021-02-18 DIAGNOSIS — O30042 Twin pregnancy, dichorionic/diamniotic, second trimester: Secondary | ICD-10-CM

## 2021-02-18 DIAGNOSIS — Z3A16 16 weeks gestation of pregnancy: Secondary | ICD-10-CM

## 2021-02-18 DIAGNOSIS — O0992 Supervision of high risk pregnancy, unspecified, second trimester: Secondary | ICD-10-CM

## 2021-02-18 NOTE — Progress Notes (Signed)
  Routine Prenatal Care Visit  Subjective  Kendra Ward is a 25 y.o. G2P0010 at [redacted]w[redacted]d being seen today for ongoing prenatal care.  She is currently monitored for the following issues for this high-risk pregnancy and has LLQ pain; Left ovarian cyst; Dichorionic diamniotic twin pregnancy in first trimester; and Supervision of high risk pregnancy, antepartum on their problem list.  ----------------------------------------------------------------------------------- Patient reports no complaints.   Contractions: Not present. Vag. Bleeding: None.  Movement: Present. Leaking Fluid denies.  ----------------------------------------------------------------------------------- The following portions of the patient's history were reviewed and updated as appropriate: allergies, current medications, past family history, past medical history, past social history, past surgical history and problem list. Problem list updated.  Objective  Blood pressure 120/78, weight 151 lb (68.5 kg), last menstrual period 10/29/2020. Pregravid weight 150 lb (68 kg) Total Weight Gain 1 lb (0.454 kg) Urinalysis: Urine Protein    Urine Glucose    Fetal Status: Fetal Heart Rate (bpm): 164/144   Movement: Present    FHRs by bedside u/s.  General:  Alert, oriented and cooperative. Patient is in no acute distress.  Skin: Skin is warm and dry. No rash noted.   Cardiovascular: Normal heart rate noted  Respiratory: Normal respiratory effort, no problems with respiration noted  Abdomen: Soft, gravid, appropriate for gestational age.       Pelvic:  Cervical exam deferred        Extremities: Normal range of motion.     Mental Status: Normal mood and affect. Normal behavior. Normal judgment and thought content.   Assessment   25 y.o. G2P0010 at [redacted]w[redacted]d by  08/05/2021, by Last Menstrual Period presenting for routine prenatal visit  Plan   March 2022 Problems (from 12/17/20 to present)    Problem Noted Resolved   Supervision  of high risk pregnancy, antepartum 12/24/2020 by Conard Novak, MD No   Overview Signed 01/22/2021 11:24 AM by Conard Novak, MD    [facility] Prenatal Labs  Dating L=8 Blood type: --/--/O POS (02/15 2334)   Genetic Screen NIPS: desires Antibody:NEG (02/15 2334)  Anatomic [place]  Rubella:   Varicella: @VZVIGG @  GTT Third trimester:  RPR:     Rhogam  HBsAg:     TDaP vaccine                       Flu Shot: HIV:     Baby Food                                GBS:   Contraception  Pap: NILM 12/2020  CBB     CS/VBAC    Support Person                Preterm labor symptoms and general obstetric precautions including but not limited to vaginal bleeding, contractions, leaking of fluid and fetal movement were reviewed in detail with the patient. Please refer to After Visit Summary for other counseling recommendations.   Anatomy u/s already scheduled for 03/10/21 with MFM.  Return in about 4 weeks (around 03/18/2021) for Routine Prenatal Appointment.  05/18/2021, MD, Thomas[facility] Mohair [facility], [facility] 02/18/2021 11:49 AM

## 2021-03-04 ENCOUNTER — Other Ambulatory Visit: Payer: Self-pay | Admitting: Obstetrics and Gynecology

## 2021-03-04 DIAGNOSIS — O0991 Supervision of high risk pregnancy, unspecified, first trimester: Secondary | ICD-10-CM

## 2021-03-04 DIAGNOSIS — O30041 Twin pregnancy, dichorionic/diamniotic, first trimester: Secondary | ICD-10-CM

## 2021-03-10 ENCOUNTER — Ambulatory Visit: Payer: Medicaid Other | Attending: Obstetrics and Gynecology

## 2021-03-10 ENCOUNTER — Other Ambulatory Visit: Payer: Self-pay

## 2021-03-10 DIAGNOSIS — O30042 Twin pregnancy, dichorionic/diamniotic, second trimester: Secondary | ICD-10-CM

## 2021-03-10 DIAGNOSIS — Z3A18 18 weeks gestation of pregnancy: Secondary | ICD-10-CM | POA: Diagnosis not present

## 2021-03-10 DIAGNOSIS — O0991 Supervision of high risk pregnancy, unspecified, first trimester: Secondary | ICD-10-CM

## 2021-03-10 DIAGNOSIS — O30041 Twin pregnancy, dichorionic/diamniotic, first trimester: Secondary | ICD-10-CM

## 2021-03-11 ENCOUNTER — Encounter: Payer: Self-pay | Admitting: Obstetrics & Gynecology

## 2021-03-11 ENCOUNTER — Ambulatory Visit (INDEPENDENT_AMBULATORY_CARE_PROVIDER_SITE_OTHER): Payer: Medicaid Other | Admitting: Obstetrics & Gynecology

## 2021-03-11 VITALS — BP 100/60 | Wt 159.0 lb

## 2021-03-11 DIAGNOSIS — O0992 Supervision of high risk pregnancy, unspecified, second trimester: Secondary | ICD-10-CM

## 2021-03-11 DIAGNOSIS — O30042 Twin pregnancy, dichorionic/diamniotic, second trimester: Secondary | ICD-10-CM

## 2021-03-11 DIAGNOSIS — Z3A19 19 weeks gestation of pregnancy: Secondary | ICD-10-CM

## 2021-03-11 NOTE — Progress Notes (Signed)
  Subjective  Fetal Movement? yes Contractions? no Leaking Fluid? no Vaginal Bleeding? no Nausea minimal  Objective  BP 100/60   Wt 159 lb (72.1 kg)   LMP 10/29/2020   BMI 25.66 kg/m  General: NAD Pumonary: no increased work of breathing Abdomen: gravid, non-tender Extremities: no edema Psychiatric: mood appropriate, affect full See [place] report from yesterday (MFM)  Assessment  25 y.o. G2P0010 at [redacted]w[redacted]d by  08/05/2021, by Last Menstrual Period presenting for routine prenatal visit  Plan   Problem List Items Addressed This Visit      Other   Dichorionic diamniotic twin pregnancy in first trimester   Supervision of high risk pregnancy, antepartum - Primary    Other Visit Diagnoses    [redacted] weeks gestation of pregnancy          March 2022 Problems (from 12/17/20 to present)    Problem Noted Resolved   Supervision of high risk pregnancy, antepartum 12/24/2020 by Kendra Novak, MD No   Overview Addendum 03/11/2021  3:15 PM by Kendra Mustard, MD    [facility] Prenatal Labs  Dating L=8 Blood type: O/Positive/-- (04/14 1137)   Genetic Screen NIPS: desires Antibody:Negative (04/14 1137)  Anatomic [place] MFM nml Rubella: 5.03 (04/14 1137) Varicella: Imm  GTT Third trimester:  RPR: Non Reactive (04/14 1137)   Rhogam n/a HBsAg: Negative (04/14 1137)   TDaP vaccine                       Flu Shot: HIV: Non Reactive (04/14 1137)   Baby Food                                GBS:   Contraception  Pap: NILM 12/2020  CBB  no   CS/VBAC n/a   Support Person     DiDi Twins        Previous Version   Dichorionic diamniotic twin pregnancy in first trimester 12/08/2020 by Kendra Mustard, MD No   Overview Signed 03/11/2021  3:14 PM by Kendra Mustard, MD    Growth [place] monthly APT (BPP) 36 wks Delivery by 38 wks Baby ASA daily after 12 wks         Kendra Major, MD, Kendra Ward Ob/Gyn, [facility] 03/11/2021  3:24 PM

## 2021-03-11 NOTE — Patient Instructions (Signed)
Thank you for choosing [facility]. As part of our ongoing efforts to improve patient experience, we would appreciate your feedback. Please fill out the short survey that you will receive by mail or MyChart. Your opinion is important to us! -Dr Neita Landrigan  Second Trimester of Pregnancy  The second trimester of pregnancy is from week 13 through week 27. This is months 4 through 6 of pregnancy. The second trimester is often a time when you feel your best. Your body has adjusted to being pregnant, and you begin to feel better physically. During the second trimester:  Morning sickness has lessened or stopped completely.  You may have more energy.  You may have an increase in appetite. The second trimester is also a time when the unborn baby (fetus) is growing rapidly. At the end of the sixth month, the fetus may be up to 12 inches long and weigh about 1 pounds. You will likely begin to feel the baby move (quickening) between 16 and 20 weeks of pregnancy. Body changes during your second trimester Your body continues to go through many changes during your second trimester. The changes vary and generally return to normal after the baby is born. Physical changes  Your weight will continue to increase. You will notice your lower abdomen bulging out.  You may begin to get stretch marks on your hips, abdomen, and breasts.  Your breasts will continue to grow and to become tender.  Dark spots or blotches (chloasma or mask of pregnancy) may develop on your face.  A dark line from your belly button to the pubic area (linea nigra) may appear.  You may have changes in your hair. These can include thickening of your hair, rapid growth, and changes in texture. Some people also have hair loss during or after pregnancy, or hair that feels dry or thin. Health changes  You may develop headaches.  You may have heartburn.  You may develop constipation.  You may develop hemorrhoids or swollen, bulging  veins (varicose veins).  Your gums may bleed and may be sensitive to brushing and flossing.  You may urinate more often because the fetus is pressing on your bladder.  You may have back pain. This is caused by: ? Weight gain. ? Pregnancy hormones that are relaxing the joints in your pelvis. ? A shift in weight and the muscles that support your balance. Follow these instructions at home: Medicines  Follow your health care provider's instructions regarding medicine use. Specific medicines may be either safe or unsafe to take during pregnancy. Do not take any medicines unless approved by your health care provider.  Take a prenatal vitamin that contains at least 600 micrograms (mcg) of folic acid. Eating and drinking  Eat a healthy diet that includes fresh fruits and vegetables, whole grains, good sources of protein such as meat, eggs, or tofu, and low-fat dairy products.  Avoid raw meat and unpasteurized juice, milk, and cheese. These carry germs that can harm you and your baby.  You may need to take these actions to prevent or treat constipation: ? Drink enough fluid to keep your urine pale yellow. ? Eat foods that are high in fiber, such as beans, whole grains, and fresh fruits and vegetables. ? Limit foods that are high in fat and processed sugars, such as fried or sweet foods. Activity  Exercise only as directed by your health care provider. Most people can continue their usual exercise routine during pregnancy. Try to exercise for 30 minutes at least   5 days a week. Stop exercising if you develop contractions in your uterus.  Stop exercising if you develop pain or cramping in the lower abdomen or lower back.  Avoid exercising if it is very hot or humid or if you are at a high altitude.  Avoid heavy lifting.  If you choose to, you may have sex unless your health care provider tells you not to. Relieving pain and discomfort  Wear a supportive bra to prevent discomfort from  breast tenderness.  Take warm sitz baths to soothe any pain or discomfort caused by hemorrhoids. Use hemorrhoid cream if your health care provider approves.  Rest with your legs raised (elevated) if you have leg cramps or low back pain.  If you develop varicose veins: ? Wear support hose as told by your health care provider. ? Elevate your feet for 15 minutes, 3-4 times a day. ? Limit salt in your diet. Safety  Wear your seat belt at all times when driving or riding in a car.  Talk with your health care provider if someone is verbally or physically abusive to you. Lifestyle  Do not use hot tubs, steam rooms, or saunas.  Do not douche. Do not use tampons or scented sanitary pads.  Avoid cat litter boxes and soil used by cats. These carry germs that can cause birth defects in the baby and possibly loss of the fetus by miscarriage or stillbirth.  Do not use herbal remedies, alcohol, illegal drugs, or medicines that are not approved by your health care provider. Chemicals in these products can harm your baby.  Do not use any products that contain nicotine or tobacco, such as cigarettes, e-cigarettes, and chewing tobacco. If you need help quitting, ask your health care provider. General instructions  During a routine prenatal visit, your health care provider will do a physical exam and other tests. He or she will also discuss your overall health. Keep all follow-up visits. This is important.  Ask your health care provider for a referral to a local prenatal education class.  Ask for help if you have counseling or nutritional needs during pregnancy. Your health care provider can offer advice or refer you to specialists for help with various needs. Where to find more information  American Pregnancy Association: americanpregnancy.org  American College of Obstetricians and Gynecologists: acog.org/en/Womens%20Health/Pregnancy  Office on Women's Health: womenshealth.gov/pregnancy Contact  a health care provider if you have:  A headache that does not go away when you take medicine.  Vision changes or you see spots in front of your eyes.  Mild pelvic cramps, pelvic pressure, or nagging pain in the abdominal area.  Persistent nausea, vomiting, or diarrhea.  A bad-smelling vaginal discharge or foul-smelling urine.  Pain when you urinate.  Sudden or extreme swelling of your face, hands, ankles, feet, or legs.  A fever. Get help right away if you:  Have fluid leaking from your vagina.  Have spotting or bleeding from your vagina.  Have severe abdominal cramping or pain.  Have difficulty breathing.  Have chest pain.  Have fainting spells.  Have not felt your baby move for the time period told by your health care provider.  Have new or increased pain, swelling, or redness in an arm or leg. Summary  The second trimester of pregnancy is from week 13 through week 27 (months 4 through 6).  Do not use herbal remedies, alcohol, illegal drugs, or medicines that are not approved by your health care provider. Chemicals in these products can   harm your baby.  Exercise only as directed by your health care provider. Most people can continue their usual exercise routine during pregnancy.  Keep all follow-up visits. This is important. This information is not intended to replace advice given to you by your health care provider. Make sure you discuss any questions you have with your health care provider. Document Revised: 03/05/2020 Document Reviewed: 01/10/2020 Elsevier Patient Education  2021 Elsevier Inc.  

## 2021-03-20 ENCOUNTER — Ambulatory Visit (INDEPENDENT_AMBULATORY_CARE_PROVIDER_SITE_OTHER): Payer: Medicaid Other | Admitting: Obstetrics and Gynecology

## 2021-03-20 ENCOUNTER — Other Ambulatory Visit: Payer: Self-pay

## 2021-03-20 ENCOUNTER — Encounter: Payer: Self-pay | Admitting: Obstetrics and Gynecology

## 2021-03-20 VITALS — BP 120/70 | Wt 162.0 lb

## 2021-03-20 DIAGNOSIS — Z3A20 20 weeks gestation of pregnancy: Secondary | ICD-10-CM

## 2021-03-20 DIAGNOSIS — O30042 Twin pregnancy, dichorionic/diamniotic, second trimester: Secondary | ICD-10-CM

## 2021-03-20 DIAGNOSIS — O0992 Supervision of high risk pregnancy, unspecified, second trimester: Secondary | ICD-10-CM

## 2021-03-20 LAB — POCT URINALYSIS DIPSTICK OB
Glucose, UA: NEGATIVE
POC,PROTEIN,UA: NEGATIVE

## 2021-03-20 NOTE — Progress Notes (Signed)
Routine Prenatal Care Visit  Subjective  Kendra Ward is a 25 y.o. G2P0010 at [redacted]w[redacted]d being seen today for ongoing prenatal care.  She is currently monitored for the following issues for this high-risk pregnancy and has LLQ pain; Left ovarian cyst; Dichorionic diamniotic twin pregnancy in first trimester; and Supervision of high risk pregnancy, antepartum on their problem list.  ----------------------------------------------------------------------------------- Patient reports no complaints.   Contractions: Not present. Vag. Bleeding: None.  Movement: Present. Leaking Fluid denies.  ----------------------------------------------------------------------------------- The following portions of the patient's history were reviewed and updated as appropriate: allergies, current medications, past family history, past medical history, past social history, past surgical history and problem list. Problem list updated.  Objective  Blood pressure 120/70, weight 162 lb (73.5 kg), last menstrual period 10/29/2020. Pregravid weight 150 lb (68 kg) Total Weight Gain 12 lb (5.443 kg) Urinalysis: Urine Protein Negative  Urine Glucose Negative  Fetal Status: Fetal Heart Rate (bpm): 153/1448   Movement: Present     General:  Alert, oriented and cooperative. Patient is in no acute distress.  Skin: Skin is warm and dry. No rash noted.   Cardiovascular: Normal heart rate noted  Respiratory: Normal respiratory effort, no problems with respiration noted  Abdomen: Soft, gravid, appropriate for gestational age. Pain/Pressure: Present     Pelvic:  Cervical exam deferred        Extremities: Normal range of motion.  Edema: None  Mental Status: Normal mood and affect. Normal behavior. Normal judgment and thought content.   Assessment   25 y.o. G2P0010 at [redacted]w[redacted]d by  08/05/2021, by Last Menstrual Period presenting for routine prenatal visit  Plan   March 2022 Problems (from 12/17/20 to present)     Problem  Noted Resolved   Supervision of high risk pregnancy, antepartum 12/24/2020 by Conard Novak, MD No   Overview Addendum 03/11/2021  3:15 PM by Nadara Mustard, MD    Clinic [facility] Prenatal Labs  Dating L=8 Blood type: O/Positive/-- (04/14 1137)   Genetic Screen NIPS: desires Antibody:Negative (04/14 1137)  Anatomic [place] MFM nml Rubella: 5.03 (04/14 1137) Varicella: Imm  GTT Third trimester:  RPR: Non Reactive (04/14 1137)   Rhogam n/a HBsAg: Negative (04/14 1137)   TDaP vaccine                       Flu Shot: HIV: Non Reactive (04/14 1137)   Baby Food                                GBS:   Contraception  Pap: NILM 12/2020  CBB  no   CS/VBAC n/a   Support Person     DiDi Twins         Dichorionic diamniotic twin pregnancy in first trimester 12/08/2020 by Nadara Mustard, MD No   Overview Signed 03/11/2021  3:14 PM by Nadara Mustard, MD    Growth [place] monthly APT (BPP) 36 wks Delivery by 38 wks Baby ASA daily after 12 wks            Preterm labor symptoms and general obstetric precautions including but not limited to vaginal bleeding, contractions, leaking of fluid and fetal movement were reviewed in detail with the patient. Please refer to After Visit Summary for other counseling recommendations.   - normal anatomy scan  Return in about 4 weeks (around 04/17/2021) for Routine Prenatal Appointment.   Thomasene Mohair, MD, Evern Core  [facility], [facility] 03/20/2021 12:46 PM

## 2021-03-27 ENCOUNTER — Encounter: Payer: Self-pay | Admitting: Obstetrics & Gynecology

## 2021-03-27 ENCOUNTER — Ambulatory Visit (INDEPENDENT_AMBULATORY_CARE_PROVIDER_SITE_OTHER): Payer: Medicaid Other | Admitting: Obstetrics & Gynecology

## 2021-03-27 ENCOUNTER — Other Ambulatory Visit: Payer: Self-pay | Admitting: Obstetrics & Gynecology

## 2021-03-27 ENCOUNTER — Other Ambulatory Visit: Payer: Self-pay

## 2021-03-27 VITALS — BP 110/60 | Wt 198.0 lb

## 2021-03-27 DIAGNOSIS — G43109 Migraine with aura, not intractable, without status migrainosus: Secondary | ICD-10-CM | POA: Diagnosis not present

## 2021-03-27 MED ORDER — BUTALBITAL-APAP-CAFFEINE 50-325-40 MG PO CAPS: 1.0000 | ORAL_CAPSULE | Freq: Four times a day (QID) | ORAL | 3 refills | Status: DC | PRN

## 2021-03-27 MED ORDER — BUTALBITAL-APAP-CAFFEINE 50-325-40 MG PO CAPS: 1.0000 | ORAL_CAPSULE | Freq: Four times a day (QID) | ORAL | 1 refills | Status: DC | PRN

## 2021-03-27 NOTE — Addendum Note (Signed)
Addended by: Nadara Mustard on: 03/27/2021 02:52 PM   Modules accepted: Orders

## 2021-03-27 NOTE — Progress Notes (Signed)
Subjective:    Kendra Ward is a 25 y.o. female who presents for follow-up of  recent onset headache although she does report h/o migraine headaches.  None this pregnancy until 3 days ago.  She is 21 weeks. Home treatment has included  Tyelnol  with little improvement. Headaches are occurring  during day for the last 3 days . They hurt mostly behind the left eye.  Generally, the headaches last about a few hours. Work attendance or other daily activities are affected by the headaches. The patient denies dizziness, loss of balance, numbness of extremities, speech difficulties, vision problems, and vomiting in the early morning.  The following portions of the patient's history were reviewed and updated as appropriate: allergies, current medications, past family history, past medical history, past social history, past surgical history, and problem list.  Review of Systems A comprehensive review of systems was negative.    Objective:  BP 110/60   Wt 198 lb (89.8 kg)   LMP 10/29/2020   BMI 31.96 kg/m    Physical Exam Constitutional:      General: She is not in acute distress.    Appearance: She is well-developed.  Musculoskeletal:        General: Normal range of motion.  Neurological:     Mental Status: She is alert and oriented to person, place, and time.  Skin:    General: Skin is warm and dry.  Vitals reviewed.      Assessment:    Classic migraine  Pregnancy   Plan:    Continue present treatment and plan. Lie in darkened room and apply cold packs as needed for pain. Episodic therapy: Fioricet due to low frequency of pain.  Fluids, rest  Annamarie Major, MD, [facility], [facility] 03/27/2021  2:37 PM

## 2021-04-01 ENCOUNTER — Other Ambulatory Visit: Payer: Self-pay | Admitting: Obstetrics and Gynecology

## 2021-04-01 DIAGNOSIS — O30042 Twin pregnancy, dichorionic/diamniotic, second trimester: Secondary | ICD-10-CM

## 2021-04-06 ENCOUNTER — Other Ambulatory Visit: Payer: Self-pay | Admitting: Obstetrics & Gynecology

## 2021-04-06 DIAGNOSIS — G43109 Migraine with aura, not intractable, without status migrainosus: Secondary | ICD-10-CM

## 2021-04-06 NOTE — Telephone Encounter (Signed)
Please advise 

## 2021-04-09 ENCOUNTER — Ambulatory Visit: Payer: Medicaid Other | Attending: Obstetrics

## 2021-04-09 ENCOUNTER — Other Ambulatory Visit: Payer: Self-pay

## 2021-04-09 DIAGNOSIS — Z3A23 23 weeks gestation of pregnancy: Secondary | ICD-10-CM | POA: Diagnosis not present

## 2021-04-09 DIAGNOSIS — Z3A24 24 weeks gestation of pregnancy: Secondary | ICD-10-CM | POA: Insufficient documentation

## 2021-04-09 DIAGNOSIS — O30042 Twin pregnancy, dichorionic/diamniotic, second trimester: Secondary | ICD-10-CM | POA: Diagnosis not present

## 2021-04-10 ENCOUNTER — Ambulatory Visit
Admission: EM | Admit: 2021-04-10 | Discharge: 2021-04-10 | Disposition: A | Discharge status: Home / Self Care | Payer: Medicaid Other | Attending: Family Medicine | Admitting: Family Medicine

## 2021-04-10 DIAGNOSIS — Z3A23 23 weeks gestation of pregnancy: Secondary | ICD-10-CM | POA: Insufficient documentation

## 2021-04-10 DIAGNOSIS — O30042 Twin pregnancy, dichorionic/diamniotic, second trimester: Secondary | ICD-10-CM | POA: Insufficient documentation

## 2021-04-10 DIAGNOSIS — O98812 Other maternal infectious and parasitic diseases complicating pregnancy, second trimester: Secondary | ICD-10-CM | POA: Insufficient documentation

## 2021-04-10 DIAGNOSIS — J069 Acute upper respiratory infection, unspecified: Secondary | ICD-10-CM | POA: Diagnosis present

## 2021-04-10 DIAGNOSIS — Z87891 Personal history of nicotine dependence: Secondary | ICD-10-CM | POA: Diagnosis not present

## 2021-04-10 DIAGNOSIS — Z20822 Contact with and (suspected) exposure to covid-19: Secondary | ICD-10-CM | POA: Diagnosis not present

## 2021-04-10 LAB — RESP PANEL BY RT-PCR (FLU A&B, COVID) ARPGX2
Influenza A by PCR: NEGATIVE
Influenza B by PCR: NEGATIVE
SARS Coronavirus 2 by RT PCR: NEGATIVE

## 2021-04-10 LAB — GROUP A STREP BY PCR: Group A Strep by PCR: NOT DETECTED

## 2021-04-10 NOTE — ED Triage Notes (Signed)
Patient complains of sore throat and chest tightness that started yesterday morning. Patient states that she is [redacted] weeks pregnant.

## 2021-04-10 NOTE — Discharge Instructions (Addendum)
Rest. Fluids.  Medications as approved by your OB-GYN.  If you develop SOB or any other worrisome symptoms, please go to the ER.  Take care  Dr. Adriana Simas

## 2021-04-10 NOTE — ED Provider Notes (Signed)
[facility]    CSN: [id number] Arrival date & time: 04/10/21  1236      History   Chief Complaint Chief Complaint  Patient presents with   Sore Throat    HPI 25 year old female presents with respiratory symptoms.  Patient reports that she has had sore throat and congestion since yesterday.  She also reports some chest tightness.  Denies cough.  No reports of shortness of breath.  No fever.  She denies any chest tightness at this time.  No relieving factors.  No other complaints or concerns at this time.  Of note, patient is currently pregnant with high risk gestation as she is having twins.   Past Medical History:  Diagnosis Date   Headache    No known health problems    Ovarian cyst 2022   L side-ruptured    Patient Active Problem List   Diagnosis Date Noted   Supervision of high risk pregnancy, antepartum 12/24/2020   Dichorionic diamniotic twin pregnancy in first trimester 12/08/2020   LLQ pain    Left ovarian cyst     Past Surgical History:  Procedure Laterality Date   DIAGNOSTIC LAPAROSCOPY WITH REMOVAL OF ECTOPIC PREGNANCY N/A 11/26/2020   Procedure: DIAGNOSTIC LAPAROSCOPY;  Surgeon: Nadara Mustard, MD;  Location: [facility] ORS;  Service: Gynecology;  Laterality: N/A;    OB History     Gravida  2   Para  0   Term      Preterm      AB  1   Living  0      SAB  1   IAB      Ectopic      Multiple      Live Births  0            Home Medications    Prior to Admission medications   Medication Sig Start Date End Date Taking? Authorizing Provider  Butalbital-APAP-Caffeine 50-325-40 MG capsule Take 1-2 capsules by mouth every 6 (six) hours as needed for headache. 03/27/21  Yes Nadara Mustard, MD  Prenatal Vit-Fe Fumarate-FA (MULTIVITAMIN-PRENATAL) 27-0.8 MG TABS tablet Take 1 tablet by mouth daily at 12 noon.   Yes [provider]    Family History Family History  Problem Relation Age of Onset   Diabetes Maternal  Uncle    Diabetes Maternal Grandmother    Diabetes Maternal Grandfather     Social History Social History   Tobacco Use   Smoking status: Former    Pack years: 0.00    Types: Cigarettes    Quit date: 11/29/2020    Years since quitting: 0.3   Smokeless tobacco: Never  Vaping Use   Vaping Use: Never used  Substance Use Topics   Alcohol use: Not Currently   Drug use: Yes    Types: Marijuana    Comment: 11/29/2020     Allergies   Latex   Review of Systems Review of Systems Per HPI  Physical Exam Triage Vital Signs ED Triage Vitals  Enc Vitals Group     BP 04/10/21 1303 118/72     Pulse Rate 04/10/21 1303 (!) 108     Resp 04/10/21 1303 16     Temp 04/10/21 1303 98.5 F (36.9 C)     Temp Source 04/10/21 1303 Oral     SpO2 04/10/21 1303 98 %     Weight 04/10/21 1302 171 lb (77.6 kg)     Height 04/10/21 1302 5' 6.5" (1.689 m)  Head Circumference --      Peak Flow --      Pain Score 04/10/21 1302 8     Pain Loc --      Pain Edu? --      Excl. in GC? --    Updated Vital Signs BP 118/72 (BP Location: Left Arm)   Pulse (!) 108   Temp 98.5 F (36.9 C) (Oral)   Resp 16   Ht 5' 6.5" (1.689 m)   Wt 77.6 kg   LMP 10/29/2020   SpO2 98%   BMI 27.19 kg/m   Visual Acuity Right Eye Distance:   Left Eye Distance:   Bilateral Distance:    Right Eye Near:   Left Eye Near:    Bilateral Near:     Physical Exam Vitals and nursing note reviewed.  Constitutional:      General: She is not in acute distress.    Appearance: Normal appearance. She is not ill-appearing.  HENT:     Head: Normocephalic and atraumatic.     Right Ear: Tympanic membrane normal.     Left Ear: Tympanic membrane normal.     Nose: Congestion present.     Mouth/Throat:     Pharynx: Oropharynx is clear. No oropharyngeal exudate or posterior oropharyngeal erythema.  Eyes:     General:        Right eye: No discharge.        Left eye: No discharge.     Conjunctiva/sclera: Conjunctivae  normal.  Cardiovascular:     Rate and Rhythm: Regular rhythm. Tachycardia present.  Pulmonary:     Effort: Pulmonary effort is normal.     Breath sounds: Normal breath sounds. No wheezing, rhonchi or rales.  Neurological:     Mental Status: She is alert.  Psychiatric:        Mood and Affect: Mood normal.        Behavior: Behavior normal.    UC Treatments / Results  Labs (all labs ordered are listed, but only abnormal results are displayed) Labs Reviewed  RESP PANEL BY RT-PCR (FLU A&B, COVID) ARPGX2  GROUP A STREP BY PCR    EKG   Radiology  Procedures Procedures (including critical care time)  Medications Ordered in UC Medications - No data to display  Initial Impression / Assessment and Plan / UC Course  I have reviewed the triage vital signs and the nursing notes.  Pertinent labs & imaging results that were available during my care of the patient were reviewed by me and considered in my medical decision making (see chart for details).    25 year old female presents with a viral URI.  Flu and COVID testing negative.  Strep negative.  Well-appearing on exam.  Advised to go to the hospital if she develops shortness of breath.  Advised treatment with over-the-counter medications as recommended by her OB/GYN.  Rest and fluids.  Final Clinical Impressions(s) / UC Diagnoses   Final diagnoses:  Viral URI     Discharge Instructions      Rest. Fluids.  Medications as approved by your OB-GYN.  If you develop SOB or any other worrisome symptoms, please go to the ER.  Take care  Dr. Adriana Simas    ED Prescriptions   None    PDMP not reviewed this encounter.   Tommie Sams, [place] 04/10/21 1503

## 2021-04-17 ENCOUNTER — Ambulatory Visit (INDEPENDENT_AMBULATORY_CARE_PROVIDER_SITE_OTHER): Payer: Medicaid Other | Admitting: Obstetrics and Gynecology

## 2021-04-17 ENCOUNTER — Other Ambulatory Visit: Payer: Self-pay

## 2021-04-17 VITALS — BP 100/60 | Wt 169.0 lb

## 2021-04-17 DIAGNOSIS — Z113 Encounter for screening for infections with a predominantly sexual mode of transmission: Secondary | ICD-10-CM

## 2021-04-17 DIAGNOSIS — O0992 Supervision of high risk pregnancy, unspecified, second trimester: Secondary | ICD-10-CM

## 2021-04-17 DIAGNOSIS — Z131 Encounter for screening for diabetes mellitus: Secondary | ICD-10-CM

## 2021-04-17 DIAGNOSIS — Z3A24 24 weeks gestation of pregnancy: Secondary | ICD-10-CM

## 2021-04-17 DIAGNOSIS — O30041 Twin pregnancy, dichorionic/diamniotic, first trimester: Secondary | ICD-10-CM

## 2021-04-17 LAB — POCT URINALYSIS DIPSTICK OB
Glucose, UA: NEGATIVE
POC,PROTEIN,UA: NEGATIVE

## 2021-04-17 NOTE — Progress Notes (Signed)
Routine Prenatal Care Visit  Subjective  Kendra Ward is a 25 y.o. G2P0010 at [redacted]w[redacted]d being seen today for ongoing prenatal care.  She is currently monitored for the following issues for this high-risk pregnancy and has LLQ pain; Left ovarian cyst; Dichorionic diamniotic twin pregnancy in first trimester; and Supervision of high risk pregnancy, antepartum on their problem list.  ----------------------------------------------------------------------------------- Patient reports mild pelvic pressure. No urinary or vaginal symptoms. No abnormal discharge.   Contractions: Not present. Vag. Bleeding: None.  Movement: Present. Leaking Fluid denies.  ----------------------------------------------------------------------------------- The following portions of the patient's history were reviewed and updated as appropriate: allergies, current medications, past family history, past medical history, past social history, past surgical history and problem list. Problem list updated.  Objective  Blood pressure 100/60, weight 169 lb (76.7 kg), last menstrual period 10/29/2020. Pregravid weight 150 lb (68 kg) Total Weight Gain 19 lb (8.618 kg) Urinalysis: Urine Protein Negative  Urine Glucose Negative  Fetal Status: Fetal Heart Rate (bpm): 158/145   Movement: Present     General:  Alert, oriented and cooperative. Patient is in no acute distress.  Skin: Skin is warm and dry. No rash noted.   Cardiovascular: Normal heart rate noted  Respiratory: Normal respiratory effort, no problems with respiration noted  Abdomen: Soft, gravid, appropriate for gestational age. Pain/Pressure: Absent     Pelvic:  Cervical exam deferred        Extremities: Normal range of motion.  Edema: None  Mental Status: Normal mood and affect. Normal behavior. Normal judgment and thought content.   Assessment   25 y.o. G2P0010 at [redacted]w[redacted]d by  08/05/2021, by Last Menstrual Period presenting for routine prenatal visit  Plan    March 2022 Problems (from 12/17/20 to present)     Problem Noted Resolved   Supervision of high risk pregnancy, antepartum 12/24/2020 by Conard Novak, MD No   Overview Addendum 03/11/2021  3:15 PM by Nadara Mustard, MD    Clinic [facility] Prenatal Labs  Dating L=8 Blood type: O/Positive/-- (04/14 1137)   Genetic Screen NIPS: desires Antibody:Negative (04/14 1137)  Anatomic [place] MFM nml Rubella: 5.03 (04/14 1137) Varicella: Imm  GTT Third trimester:  RPR: Non Reactive (04/14 1137)   Rhogam n/a HBsAg: Negative (04/14 1137)   TDaP vaccine                       Flu Shot: HIV: Non Reactive (04/14 1137)   Baby Food                                GBS:   Contraception  Pap: NILM 12/2020  CBB  no   CS/VBAC n/a   Support Person     DiDi Twins         Dichorionic diamniotic twin pregnancy in first trimester 12/08/2020 by Nadara Mustard, MD No   Overview Signed 03/11/2021  3:14 PM by Nadara Mustard, MD    Growth [place] monthly APT (BPP) 36 wks Delivery by 38 wks Baby ASA daily after 12 wks            Preterm labor symptoms and general obstetric precautions including but not limited to vaginal bleeding, contractions, leaking of fluid and fetal movement were reviewed in detail with the patient. Please refer to After Visit Summary for other counseling recommendations.   Return in about 4 weeks (around 05/15/2021) for 28 week labs and Routine  Prenatal Appointment.   Thomasene Mohair, MD, [facility], [facility] 04/17/2021 10:28 AM

## 2021-04-24 ENCOUNTER — Encounter: Payer: Medicaid Other | Admitting: Obstetrics and Gynecology

## 2021-05-04 NOTE — Telephone Encounter (Signed)
Patient is scheduled for 05/05/21 with RPH 

## 2021-05-05 ENCOUNTER — Encounter: Payer: Self-pay | Admitting: Obstetrics & Gynecology

## 2021-05-05 ENCOUNTER — Ambulatory Visit (INDEPENDENT_AMBULATORY_CARE_PROVIDER_SITE_OTHER): Payer: Medicaid Other | Admitting: Obstetrics & Gynecology

## 2021-05-05 ENCOUNTER — Other Ambulatory Visit: Payer: Self-pay

## 2021-05-05 VITALS — BP 118/70 | Wt 180.0 lb

## 2021-05-05 DIAGNOSIS — O1202 Gestational edema, second trimester: Secondary | ICD-10-CM | POA: Diagnosis not present

## 2021-05-05 LAB — POCT URINALYSIS DIPSTICK OB
Glucose, UA: NEGATIVE
POC,PROTEIN,UA: NEGATIVE

## 2021-05-05 NOTE — Progress Notes (Signed)
Obstetrics & Gynecology Office Visit   Chief Complaint: EDEMA  History of Present Illness: 25 y.o. G2P0010 being seen for recent symptoms of headache and more recently edema in the lower extremities. The patient is currently pregnantThe established diagnosis for the patient is  - chronic headaches; no prior h/o BP concerns; edema is recent .  She is currently on no antihypertensives.  She reports headaches.  Medication list reviewed medications which may contribute to BP elevation were not noted.  Past Medical History:  Past Medical History:  Diagnosis Date   Headache    No known health problems    Ovarian cyst 2022   L side-ruptured    Past Surgical History:  Past Surgical History:  Procedure Laterality Date   DIAGNOSTIC LAPAROSCOPY WITH REMOVAL OF ECTOPIC PREGNANCY N/A 11/26/2020   Procedure: DIAGNOSTIC LAPAROSCOPY;  Surgeon: Nadara Mustard, MD;  Location: [facility] ORS;  Service: Gynecology;  Laterality: N/A;    Gynecologic History: Patient's last menstrual period was 10/29/2020.  Obstetric History: G2P0010  Family History:  Family History  Problem Relation Age of Onset   Diabetes Maternal Uncle    Diabetes Maternal Grandmother    Diabetes Maternal Grandfather     Social History:  Social History   Socioeconomic History   Marital status: Married    Spouse name: Not on file   Number of children: Not on file   Years of education: Not on file   Highest education level: Not on file  Occupational History   Not on file  Tobacco Use   Smoking status: Former    Types: Cigarettes    Quit date: 11/29/2020    Years since quitting: 0.4   Smokeless tobacco: Never  Vaping Use   Vaping Use: Never used  Substance and Sexual Activity   Alcohol use: Not Currently   Drug use: Yes    Types: Marijuana    Comment: 11/29/2020   Sexual activity: Yes    Partners: Male    Birth control/protection: None  Other Topics Concern   Not on file  Social History Narrative   Not on  file   Social Determinants of Health   Financial Resource Strain: Not on file  Food Insecurity: Not on file  Transportation Needs: Not on file  Physical Activity: Not on file  Stress: Not on file  Social Connections: Not on file  Intimate Partner Violence: Not on file    Allergies:  Allergies  Allergen Reactions   Latex Itching    Medications: Prior to Admission medications   Medication Sig Start Date End Date Taking? Authorizing Provider  Butalbital-APAP-Caffeine 50-325-40 MG capsule Take 1-2 capsules by mouth every 6 (six) hours as needed for headache. 03/27/21   Nadara Mustard, MD  Prenatal Vit-Fe Fumarate-FA (MULTIVITAMIN-PRENATAL) 27-0.8 MG TABS tablet Take 1 tablet by mouth daily at 12 noon.    [provider]    ROS  Physical Exam Blood pressure 118/70, weight 180 lb (81.6 kg), last menstrual period 10/29/2020.  Patient's last menstrual period was 10/29/2020.  General: NAD HEENT: normocephalic, anicteric Pulmonary: No increased work of breathing Cardiovascular: RRR, distal pulses 2+ Extremities: 1+edema, no erythema, no tenderness Neurologic: Grossly intact Psychiatric: mood appropriate, affect full  URINE- NEG for PROTEIN  Assessment: 25 y.o. G2P0010 presenting for edema and headaches  Plan: Problem List Items Addressed This Visit     Edema during pregnancy in second trimester    -  Primary  Also, headaches     1)  Blood pressure - blood pressure at today's visit is normotensive.  As a result antihypertensive therapy is currently not warranted. - additional blood work was not obtained - cont Fioricet for headaches - low salt diet and exercise advised for treatment and prevention of edema - risks of future preeclampsia discussed; no s/sx of it currently  Annamarie Major, MD, [facility], [facility] 05/05/2021  10:48 AM

## 2021-05-08 ENCOUNTER — Other Ambulatory Visit: Payer: Self-pay | Admitting: Obstetrics

## 2021-05-08 DIAGNOSIS — Z3A27 27 weeks gestation of pregnancy: Secondary | ICD-10-CM

## 2021-05-08 DIAGNOSIS — O30042 Twin pregnancy, dichorionic/diamniotic, second trimester: Secondary | ICD-10-CM

## 2021-05-08 DIAGNOSIS — O0992 Supervision of high risk pregnancy, unspecified, second trimester: Secondary | ICD-10-CM

## 2021-05-12 ENCOUNTER — Other Ambulatory Visit: Payer: Self-pay

## 2021-05-12 ENCOUNTER — Ambulatory Visit: Payer: Medicaid Other | Attending: Obstetrics and Gynecology

## 2021-05-12 ENCOUNTER — Other Ambulatory Visit: Payer: Self-pay | Admitting: Obstetrics and Gynecology

## 2021-05-12 DIAGNOSIS — Z3A27 27 weeks gestation of pregnancy: Secondary | ICD-10-CM | POA: Diagnosis not present

## 2021-05-12 DIAGNOSIS — O30042 Twin pregnancy, dichorionic/diamniotic, second trimester: Secondary | ICD-10-CM

## 2021-05-12 DIAGNOSIS — O0992 Supervision of high risk pregnancy, unspecified, second trimester: Secondary | ICD-10-CM

## 2021-05-12 DIAGNOSIS — O365921 Maternal care for other known or suspected poor fetal growth, second trimester, fetus 1: Secondary | ICD-10-CM | POA: Diagnosis not present

## 2021-05-12 DIAGNOSIS — O321XX1 Maternal care for breech presentation, fetus 1: Secondary | ICD-10-CM | POA: Insufficient documentation

## 2021-05-15 ENCOUNTER — Other Ambulatory Visit: Payer: Medicaid Other

## 2021-05-15 ENCOUNTER — Other Ambulatory Visit: Payer: Self-pay

## 2021-05-15 ENCOUNTER — Encounter: Payer: Self-pay | Admitting: Obstetrics and Gynecology

## 2021-05-15 ENCOUNTER — Encounter: Payer: Medicaid Other | Admitting: Obstetrics and Gynecology

## 2021-05-15 ENCOUNTER — Ambulatory Visit (INDEPENDENT_AMBULATORY_CARE_PROVIDER_SITE_OTHER): Payer: Medicaid Other | Admitting: Obstetrics and Gynecology

## 2021-05-15 VITALS — BP 100/60 | Wt 182.0 lb

## 2021-05-15 DIAGNOSIS — O365990 Maternal care for other known or suspected poor fetal growth, unspecified trimester, not applicable or unspecified: Secondary | ICD-10-CM | POA: Insufficient documentation

## 2021-05-15 DIAGNOSIS — O0993 Supervision of high risk pregnancy, unspecified, third trimester: Secondary | ICD-10-CM

## 2021-05-15 DIAGNOSIS — Z131 Encounter for screening for diabetes mellitus: Secondary | ICD-10-CM

## 2021-05-15 DIAGNOSIS — Z113 Encounter for screening for infections with a predominantly sexual mode of transmission: Secondary | ICD-10-CM

## 2021-05-15 DIAGNOSIS — O30042 Twin pregnancy, dichorionic/diamniotic, second trimester: Secondary | ICD-10-CM

## 2021-05-15 DIAGNOSIS — O365921 Maternal care for other known or suspected poor fetal growth, second trimester, fetus 1: Secondary | ICD-10-CM

## 2021-05-15 DIAGNOSIS — O0992 Supervision of high risk pregnancy, unspecified, second trimester: Secondary | ICD-10-CM

## 2021-05-15 DIAGNOSIS — O30041 Twin pregnancy, dichorionic/diamniotic, first trimester: Secondary | ICD-10-CM

## 2021-05-15 DIAGNOSIS — Z3A28 28 weeks gestation of pregnancy: Secondary | ICD-10-CM

## 2021-05-15 NOTE — Progress Notes (Signed)
Routine Prenatal Care Visit  Subjective  Kendra Ward is a 25 y.o. G2P0010 at [redacted]w[redacted]d being seen today for ongoing prenatal care.  She is currently monitored for the following issues for this high-risk pregnancy and has LLQ pain; Left ovarian cyst; Dichorionic diamniotic twin pregnancy in first trimester; Supervision of high risk pregnancy, antepartum; and Intrauterine growth restriction (IUGR) affecting care of mother on their problem list.  ----------------------------------------------------------------------------------- Patient reports no complaints.   Contractions: Not present. Vag. Bleeding: None.  Movement: Present. Leaking Fluid denies.  ----------------------------------------------------------------------------------- The following portions of the patient's history were reviewed and updated as appropriate: allergies, current medications, past family history, past medical history, past social history, past surgical history and problem list. Problem list updated.  Objective  Blood pressure 100/60, weight 182 lb (82.6 kg), last menstrual period 10/29/2020. Pregravid weight 150 lb (68 kg) Total Weight Gain 32 lb (14.5 kg) Urinalysis: Urine Protein    Urine Glucose    Fetal Status: Fetal Heart Rate (bpm): 151/143 ([place])   Movement: Present     General:  Alert, oriented and cooperative. Patient is in no acute distress.  Skin: Skin is warm and dry. No rash noted.   Cardiovascular: Normal heart rate noted  Respiratory: Normal respiratory effort, no problems with respiration noted  Abdomen: Soft, gravid, appropriate for gestational age. Pain/Pressure: Absent     Pelvic:  Cervical exam deferred        Extremities: Normal range of motion.     Mental Status: Normal mood and affect. Normal behavior. Normal judgment and thought content.   Assessment   25 y.o. G2P0010 at [redacted]w[redacted]d by  08/05/2021, by Last Menstrual Period presenting for routine prenatal visit  Plan   March 2022 Problems  (from 12/17/20 to present)     Problem Noted Resolved   Intrauterine growth restriction (IUGR) affecting care of mother 05/15/2021 by Conard Novak, MD No   Supervision of high risk pregnancy, antepartum 12/24/2020 by Conard Novak, MD No   Overview Addendum 03/11/2021  3:15 PM by Nadara Mustard, MD    Clinic [facility] Prenatal Labs  Dating L=8 Blood type: O/Positive/-- (04/14 1137)   Genetic Screen NIPS: desires Antibody:Negative (04/14 1137)  Anatomic [place] MFM nml Rubella: 5.03 (04/14 1137) Varicella: Imm  GTT Third trimester:  RPR: Non Reactive (04/14 1137)   Rhogam n/a HBsAg: Negative (04/14 1137)   TDaP vaccine                       Flu Shot: HIV: Non Reactive (04/14 1137)   Baby Food                                GBS:   Contraception  Pap: NILM 12/2020  CBB  no   CS/VBAC n/a   Support Person     DiDi Twins        Dichorionic diamniotic twin pregnancy in first trimester 12/08/2020 by Nadara Mustard, MD No   Overview Signed 03/11/2021  3:14 PM by Nadara Mustard, MD    Growth [place] monthly APT (BPP) 36 wks Delivery by 38 wks Baby ASA daily after 12 wks            Preterm labor symptoms and general obstetric precautions including but not limited to vaginal bleeding, contractions, leaking of fluid and fetal movement were reviewed in detail with the patient. Please refer to After Visit Summary for  other counseling recommendations.   - 28 week labs today - keep f/u appts with MFM  Return in about 2 weeks (around 05/29/2021) for Routine Prenatal Appointment.   Thomasene Mohair, MD, [facility], [facility] 05/15/2021 10:52 AM

## 2021-05-19 ENCOUNTER — Telehealth: Payer: Self-pay

## 2021-05-20 ENCOUNTER — Encounter: Payer: Self-pay | Admitting: Obstetrics and Gynecology

## 2021-05-20 ENCOUNTER — Observation Stay: Payer: Medicaid Other

## 2021-05-20 ENCOUNTER — Observation Stay
Admission: EM | Admit: 2021-05-20 | Discharge: 2021-05-20 | Disposition: A | Discharge status: Home / Self Care | Payer: Medicaid Other | Attending: Obstetrics | Admitting: Obstetrics

## 2021-05-20 DIAGNOSIS — N1330 Unspecified hydronephrosis: Secondary | ICD-10-CM | POA: Insufficient documentation

## 2021-05-20 DIAGNOSIS — R109 Unspecified abdominal pain: Secondary | ICD-10-CM

## 2021-05-20 DIAGNOSIS — O30003 Twin pregnancy, unspecified number of placenta and unspecified number of amniotic sacs, third trimester: Secondary | ICD-10-CM | POA: Diagnosis not present

## 2021-05-20 DIAGNOSIS — O26893 Other specified pregnancy related conditions, third trimester: Secondary | ICD-10-CM

## 2021-05-20 DIAGNOSIS — D649 Anemia, unspecified: Secondary | ICD-10-CM | POA: Insufficient documentation

## 2021-05-20 DIAGNOSIS — O30041 Twin pregnancy, dichorionic/diamniotic, first trimester: Secondary | ICD-10-CM

## 2021-05-20 DIAGNOSIS — Z3A29 29 weeks gestation of pregnancy: Secondary | ICD-10-CM | POA: Insufficient documentation

## 2021-05-20 DIAGNOSIS — O99013 Anemia complicating pregnancy, third trimester: Secondary | ICD-10-CM

## 2021-05-20 DIAGNOSIS — O368330 Maternal care for abnormalities of the fetal heart rate or rhythm, third trimester, not applicable or unspecified: Secondary | ICD-10-CM | POA: Diagnosis not present

## 2021-05-20 DIAGNOSIS — O26833 Pregnancy related renal disease, third trimester: Secondary | ICD-10-CM | POA: Diagnosis present

## 2021-05-20 DIAGNOSIS — O0990 Supervision of high risk pregnancy, unspecified, unspecified trimester: Secondary | ICD-10-CM

## 2021-05-20 DIAGNOSIS — Z7982 Long term (current) use of aspirin: Secondary | ICD-10-CM | POA: Insufficient documentation

## 2021-05-20 LAB — COMPREHENSIVE METABOLIC PANEL
ALT: 14 U/L (ref 0–44)
AST: 24 U/L (ref 15–41)
Albumin: 2.9 g/dL — ABNORMAL LOW (ref 3.5–5.0)
Alkaline Phosphatase: 98 U/L (ref 38–126)
Anion gap: 3 — ABNORMAL LOW (ref 5–15)
BUN: 8 mg/dL (ref 6–20)
CO2: 23 mmol/L (ref 22–32)
Calcium: 8.4 mg/dL — ABNORMAL LOW (ref 8.9–10.3)
Chloride: 108 mmol/L (ref 98–111)
Creatinine, Ser: 0.57 mg/dL (ref 0.44–1.00)
GFR, Estimated: >60 mL/min (ref >60)
Glucose, Bld: 83 mg/dL (ref 70–99)
Potassium: 3.9 mmol/L (ref 3.5–5.1)
Sodium: 134 mmol/L — ABNORMAL LOW (ref 135–145)
Total Bilirubin: 0.6 mg/dL (ref 0.3–1.2)
Total Protein: 6.6 g/dL (ref 6.5–8.1)

## 2021-05-20 LAB — URINALYSIS, ROUTINE W REFLEX MICROSCOPIC
Bilirubin Urine: NEGATIVE
Glucose, UA: NEGATIVE mg/dL
Hgb urine dipstick: NEGATIVE
Ketones, ur: NEGATIVE mg/dL
Leukocytes,Ua: NEGATIVE
Nitrite: NEGATIVE
Protein, ur: NEGATIVE mg/dL
Specific Gravity, Urine: 1.013 (ref 1.005–1.030)
pH: 7 (ref 5.0–8.0)

## 2021-05-20 LAB — CBC WITH DIFFERENTIAL/PLATELET
Abs Immature Granulocytes: 0.29 10*3/uL — ABNORMAL HIGH (ref 0.00–0.07)
Basophils Absolute: 0.1 10*3/uL (ref 0.0–0.1)
Basophils Relative: 0 %
Eosinophils Absolute: 0.2 10*3/uL (ref 0.0–0.5)
Eosinophils Relative: 2 %
HCT: 26.9 % — ABNORMAL LOW (ref 36.0–46.0)
Hemoglobin: 8.8 g/dL — ABNORMAL LOW (ref 12.0–15.0)
Immature Granulocytes: 2 %
Lymphocytes Relative: 17 %
Lymphs Abs: 2.3 10*3/uL (ref 0.7–4.0)
MCH: 28.4 pg (ref 26.0–34.0)
MCHC: 32.7 g/dL (ref 30.0–36.0)
MCV: 86.8 fL (ref 80.0–100.0)
Monocytes Absolute: 1.4 10*3/uL — ABNORMAL HIGH (ref 0.1–1.0)
Monocytes Relative: 10 %
Neutro Abs: 9.6 10*3/uL — ABNORMAL HIGH (ref 1.7–7.7)
Neutrophils Relative %: 69 %
Platelets: 329 10*3/uL (ref 150–400)
RBC: 3.1 MIL/uL — ABNORMAL LOW (ref 3.87–5.11)
RDW: 14 % (ref 11.5–15.5)
WBC: 13.9 10*3/uL — ABNORMAL HIGH (ref 4.0–10.5)
nRBC: 0.1 % (ref 0.0–0.2)

## 2021-05-20 LAB — LIPASE, BLOOD: Lipase: 24 U/L (ref 11–51)

## 2021-05-20 LAB — AMYLASE: Amylase: 104 U/L — ABNORMAL HIGH (ref 28–100)

## 2021-05-20 NOTE — Progress Notes (Signed)
Pt transported downstairs for US.  

## 2021-05-20 NOTE — Discharge Summary (Signed)
Please see Final Progress Note  Mirna Mires, CNM  05/20/2021 12:29 PM

## 2021-05-20 NOTE — OB Triage Note (Signed)
Pt is a G2P0 at [redacted]w[redacted]d that presents from the ED with complaint of "Pressure, cramps, and sharp right sided abdominal pain". Pt states the pain started day before yesterday but went away yesterday and came back today. Pt states the pain is coming and going. Pt denies LOF, VB and states positive Fetal movement. EFM applied and baby A initial FHT 155 and baby B initial FHT 155.

## 2021-05-20 NOTE — Final Progress Note (Signed)
Final Progress Note  Patient ID: Kendra Ward MRN: [id number] DOB/AGE: 1996-02-15 25 y.o.  Admit date: 05/20/2021 Admitting provider: Mirna Mires, CNM Discharge date: 05/20/2021   Admission Diagnoses: Abdominal pain in pregnancy  Discharge Diagnoses:  Active Problems:   Anemia in pregnancy, third trimester  Mild hydronephrosis Twin gestation Reassuring FHTS  History of Present Illness: The patient is a 25 y.o. female G2P0010 at 109w0d who presents with complaint of upper right abdominal pain that has occurred twice today. The pain woke her up this morning at 0500, and she describes it as sharp and sudden. Lasting about a minute. She is not presently in any pain since her arrival. Her twins are moving well. She denies any regular contractions, denies vaginal bleeding or LOF.She was seen at [facility] three days ago and had an uneventful visit.   Past Medical History:  Diagnosis Date   Headache    No known health problems    Ovarian cyst 2022   L side-ruptured    Past Surgical History:  Procedure Laterality Date   DIAGNOSTIC LAPAROSCOPY WITH REMOVAL OF ECTOPIC PREGNANCY N/A 11/26/2020   Procedure: DIAGNOSTIC LAPAROSCOPY;  Surgeon: Nadara Mustard, MD;  Location: [facility] ORS;  Service: Gynecology;  Laterality: N/A;    No current facility-administered medications on file prior to encounter.   Current Outpatient Medications on File Prior to Encounter  Medication Sig Dispense Refill   aspirin 81 MG chewable tablet Chew 81 mg by mouth daily.     Butalbital-APAP-Caffeine 50-325-40 MG capsule Take 1-2 capsules by mouth every 6 (six) hours as needed for headache. 30 capsule 1   Prenatal Vit-Fe Fumarate-FA (MULTIVITAMIN-PRENATAL) 27-0.8 MG TABS tablet Take 1 tablet by mouth daily at 12 noon.      Allergies  Allergen Reactions   Latex Itching    Social History   Socioeconomic History   Marital status: Married    Spouse name: Tonye Becket   Number of children: Not on file    Years of education: Not on file   Highest education level: Not on file  Occupational History   Not on file  Tobacco Use   Smoking status: Former    Types: Cigarettes    Quit date: 11/29/2020    Years since quitting: 0.4   Smokeless tobacco: Never  Vaping Use   Vaping Use: Never used  Substance and Sexual Activity   Alcohol use: Not Currently   Drug use: Yes    Types: Marijuana    Comment: 11/29/2020   Sexual activity: Yes    Partners: Male  Other Topics Concern   Not on file  Social History Narrative   Not on file   Social Determinants of Health   Financial Resource Strain: Not on file  Food Insecurity: Not on file  Transportation Needs: Not on file  Physical Activity: Not on file  Stress: Not on file  Social Connections: Not on file  Intimate Partner Violence: Not on file    Family History  Problem Relation Age of Onset   Diabetes Maternal Uncle    Diabetes Maternal Grandmother    Diabetes Maternal Grandfather      ROS   Physical Exam: BP (!) 119/58   Pulse 81   Temp 98 F (36.7 C) (Oral)   Resp 14   Ht 5\' 6"  (1.676 m)   Wt 82.1 kg   LMP 10/29/2020   BMI 29.21 kg/m   OBGyn Exam  Consults: None  Significant Findings/ Diagnostic Studies: radiology: Ultrasound: no gallstones  or gallbladder involvement noted. Right hydronephrosis noted.  Procedures: EFM NST Baseline FHR: Twin A: 145 Twin B 140s beats/min Variability: moderate Accelerations: present Decelerations: absent Tocometry: one contraction seen. Twins are premature and therefore NST not reliable for accels of 15 x 15.  Interpretation:  INDICATIONS: rule out uterine contractions and multiple gestation RESULTS:  A NST procedure was performed with FHR monitoring and a normal baseline established, appropriate time of 20-40 minutes of evaluation, and accels >2 seen w 10 x 10 Hecharacteristics.  Results show a REACTIVE NST.    Hospital Course: The patient was admitted to Labor and Delivery  Triage for observation. She was monitored briefly and the FHTS on her twins were reassuring. Anultrasound was ordered and performed and a diagnosis of right hydronephrosis was made. Labwork indicates iron deficiency anemia. A urinalysis was essentially negative for any UTI findings.with the cessation of abdominal pain, she is discharged home with plans for follow up at [facility].  Discharge Condition: good  Disposition: Discharge disposition: 01-Home or Self Care      Diet: Regular diet  Discharge Activity: Activity as tolerated  Discharge Instructions     Notify physician for a general feeling that "something is not right"   Complete by: As directed    Notify physician for increase or change in vaginal discharge   Complete by: As directed    Notify physician for intestinal cramps, with or without diarrhea, sometimes described as "gas pain"   Complete by: As directed    Notify physician for leaking of fluid   Complete by: As directed    Notify physician for low, dull backache, unrelieved by heat or Tylenol   Complete by: As directed    Notify physician for menstrual like cramps   Complete by: As directed    Notify physician for pelvic pressure   Complete by: As directed    Notify physician for uterine contractions.  These may be painless and feel like the uterus is tightening or the baby is  "balling up"   Complete by: As directed    Notify physician for vaginal bleeding   Complete by: As directed    PRETERM LABOR:  Includes any of the follwing symptoms that occur between 20 - [redacted] weeks gestation.  If these symptoms are not stopped, preterm labor can result in preterm delivery, placing your baby at risk   Complete by: As directed       Allergies as of 05/20/2021       Reactions   Latex Itching        Medication List     TAKE these medications    aspirin 81 MG chewable tablet Chew 81 mg by mouth daily.   Butalbital-APAP-Caffeine 50-325-40 MG capsule Take 1-2  capsules by mouth every 6 (six) hours as needed for headache.   multivitamin-prenatal 27-0.8 MG Tabs tablet Take 1 tablet by mouth daily at 12 noon.         Total time spent taking care of this patient: 40 minutes  Signed: Mirna Mires, CNM  05/20/2021, 12:31 PM

## 2021-05-20 NOTE — OB Triage Note (Signed)
Patient Discharged home per provider. Pt educated about labor precautions and informed when to return to the ED for further evaluation. Pt instructed to keep all follow up appointments with her provider. AVS given to patient and RN answered all questions and patient has no further questions at this time. Pt discharged home in stable condition with significant other.   

## 2021-05-20 NOTE — Progress Notes (Signed)
Provider at bedside and reviewed fetal heart rate tracing. Provider gave verbal order to d/c fetal monitoring. [place] and labs ordered and radiology will be up shortly to take patient for scan.

## 2021-05-20 NOTE — Progress Notes (Signed)
Pt back in room from US.

## 2021-05-22 ENCOUNTER — Other Ambulatory Visit: Payer: Self-pay | Admitting: Obstetrics and Gynecology

## 2021-05-22 DIAGNOSIS — O99019 Anemia complicating pregnancy, unspecified trimester: Secondary | ICD-10-CM

## 2021-05-22 DIAGNOSIS — Z3A29 29 weeks gestation of pregnancy: Secondary | ICD-10-CM

## 2021-05-22 DIAGNOSIS — O365931 Maternal care for other known or suspected poor fetal growth, third trimester, fetus 1: Secondary | ICD-10-CM

## 2021-05-26 ENCOUNTER — Other Ambulatory Visit: Payer: Self-pay | Admitting: Obstetrics

## 2021-05-26 ENCOUNTER — Other Ambulatory Visit: Payer: Self-pay | Admitting: Obstetrics and Gynecology

## 2021-05-26 ENCOUNTER — Other Ambulatory Visit: Payer: Self-pay

## 2021-05-26 ENCOUNTER — Ambulatory Visit: Payer: Medicaid Other | Attending: Obstetrics

## 2021-05-26 VITALS — BP 113/68 | HR 93 | Temp 97.5°F | Ht 66.0 in | Wt 185.5 lb

## 2021-05-26 DIAGNOSIS — Z3A29 29 weeks gestation of pregnancy: Secondary | ICD-10-CM | POA: Diagnosis not present

## 2021-05-26 DIAGNOSIS — O30043 Twin pregnancy, dichorionic/diamniotic, third trimester: Secondary | ICD-10-CM

## 2021-05-26 DIAGNOSIS — O0993 Supervision of high risk pregnancy, unspecified, third trimester: Secondary | ICD-10-CM

## 2021-05-26 DIAGNOSIS — O0990 Supervision of high risk pregnancy, unspecified, unspecified trimester: Secondary | ICD-10-CM

## 2021-05-26 DIAGNOSIS — O365931 Maternal care for other known or suspected poor fetal growth, third trimester, fetus 1: Secondary | ICD-10-CM

## 2021-05-26 DIAGNOSIS — O99019 Anemia complicating pregnancy, unspecified trimester: Secondary | ICD-10-CM

## 2021-05-26 DIAGNOSIS — O99013 Anemia complicating pregnancy, third trimester: Secondary | ICD-10-CM

## 2021-06-01 ENCOUNTER — Other Ambulatory Visit: Payer: Self-pay | Admitting: Obstetrics

## 2021-06-01 DIAGNOSIS — O30043 Twin pregnancy, dichorionic/diamniotic, third trimester: Secondary | ICD-10-CM

## 2021-06-01 DIAGNOSIS — O365931 Maternal care for other known or suspected poor fetal growth, third trimester, fetus 1: Secondary | ICD-10-CM

## 2021-06-04 ENCOUNTER — Ambulatory Visit: Payer: Medicaid Other | Attending: Maternal & Fetal Medicine

## 2021-06-04 ENCOUNTER — Other Ambulatory Visit: Payer: Self-pay

## 2021-06-04 DIAGNOSIS — Z3A31 31 weeks gestation of pregnancy: Secondary | ICD-10-CM

## 2021-06-04 DIAGNOSIS — O30043 Twin pregnancy, dichorionic/diamniotic, third trimester: Secondary | ICD-10-CM

## 2021-06-04 DIAGNOSIS — O321XX1 Maternal care for breech presentation, fetus 1: Secondary | ICD-10-CM

## 2021-06-04 DIAGNOSIS — O365931 Maternal care for other known or suspected poor fetal growth, third trimester, fetus 1: Secondary | ICD-10-CM

## 2021-06-05 ENCOUNTER — Ambulatory Visit (INDEPENDENT_AMBULATORY_CARE_PROVIDER_SITE_OTHER): Payer: Medicaid Other | Admitting: Obstetrics and Gynecology

## 2021-06-05 ENCOUNTER — Encounter: Payer: Self-pay | Admitting: Obstetrics and Gynecology

## 2021-06-05 VITALS — BP 118/74 | Wt 188.0 lb

## 2021-06-05 DIAGNOSIS — Z3A31 31 weeks gestation of pregnancy: Secondary | ICD-10-CM

## 2021-06-05 DIAGNOSIS — O365921 Maternal care for other known or suspected poor fetal growth, second trimester, fetus 1: Secondary | ICD-10-CM

## 2021-06-05 DIAGNOSIS — O0993 Supervision of high risk pregnancy, unspecified, third trimester: Secondary | ICD-10-CM

## 2021-06-05 DIAGNOSIS — O99013 Anemia complicating pregnancy, third trimester: Secondary | ICD-10-CM

## 2021-06-05 DIAGNOSIS — O30043 Twin pregnancy, dichorionic/diamniotic, third trimester: Secondary | ICD-10-CM

## 2021-06-05 NOTE — Progress Notes (Signed)
Routine Prenatal Care Visit  Subjective  Kendra Ward is a 25 y.o. G2P0010 at [redacted]w[redacted]d being seen today for ongoing prenatal care.  She is currently monitored for the following issues for this high-risk pregnancy and has LLQ pain; Left ovarian cyst; Twin gestation in third trimester; Supervision of high risk pregnancy, antepartum; Intrauterine growth restriction (IUGR) affecting care of mother; Anemia in pregnancy, third trimester; and Abdominal pain in pregnancy, third trimester on their problem list.  ----------------------------------------------------------------------------------- Patient reports no complaints.   Contractions: Not present. Vag. Bleeding: None.  Movement: Present. Leaking Fluid denies.  ----------------------------------------------------------------------------------- The following portions of the patient's history were reviewed and updated as appropriate: allergies, current medications, past family history, past medical history, past social history, past surgical history and problem list. Problem list updated.  Objective  Blood pressure 118/74, weight 188 lb (85.3 kg), last menstrual period 10/29/2020. Pregravid weight 150 lb (68 kg) Total Weight Gain 38 lb (17.2 kg) Urinalysis: Urine Protein    Urine Glucose    Fetal Status: Fetal Heart Rate (bpm): 138/152   Movement: Present     General:  Alert, oriented and cooperative. Patient is in no acute distress.  Skin: Skin is warm and dry. No rash noted.   Cardiovascular: Normal heart rate noted  Respiratory: Normal respiratory effort, no problems with respiration noted  Abdomen: Soft, gravid, appropriate for gestational age. Pain/Pressure: Absent     Pelvic:  Cervical exam deferred        Extremities: Normal range of motion.     Mental Status: Normal mood and affect. Normal behavior. Normal judgment and thought content.   Assessment   25 y.o. G2P0010 at [redacted]w[redacted]d by  08/05/2021, by Last Menstrual Period presenting for  routine prenatal visit  Plan   March 2022 Problems (from 12/17/20 to present)     Problem Noted Resolved   Intrauterine growth restriction (IUGR) affecting care of mother 05/15/2021 by Conard Novak, MD No   Supervision of high risk pregnancy, antepartum 12/24/2020 by Conard Novak, MD No   Overview Addendum 03/11/2021  3:15 PM by Nadara Mustard, MD    Clinic [facility] Prenatal Labs  Dating L=8 Blood type: O/Positive/-- (04/14 1137)   Genetic Screen NIPS: desires Antibody:Negative (04/14 1137)  Anatomic [place] MFM nml Rubella: 5.03 (04/14 1137) Varicella: Imm  GTT Third trimester:  RPR: Non Reactive (04/14 1137)   Rhogam n/a HBsAg: Negative (04/14 1137)   TDaP vaccine                       Flu Shot: HIV: Non Reactive (04/14 1137)   Baby Food                                GBS:   Contraception  Pap: NILM 12/2020  CBB  no   CS/VBAC n/a   Support Person     DiDi Twins        Twin gestation in third trimester 12/08/2020 by Nadara Mustard, MD No   Overview Signed 03/11/2021  3:14 PM by Nadara Mustard, MD    Growth [place] monthly APT (BPP) 36 wks Delivery by 38 wks Baby ASA daily after 12 wks           Preterm labor symptoms and general obstetric precautions including but not limited to vaginal bleeding, contractions, leaking of fluid and fetal movement were reviewed in detail with the patient. Please refer  to After Visit Summary for other counseling recommendations.   - referral to heme for iron def anemia - continue with MFM visits for FGR of baby A.   Return in about 1 week (around 06/12/2021) for ROB.   Thomasene Mohair, MD, [facility], [facility] 06/05/2021 1:32 PM

## 2021-06-06 LAB — 28 WEEK RH+PANEL

## 2021-06-10 ENCOUNTER — Other Ambulatory Visit: Payer: Self-pay | Admitting: Maternal & Fetal Medicine

## 2021-06-10 DIAGNOSIS — O365931 Maternal care for other known or suspected poor fetal growth, third trimester, fetus 1: Secondary | ICD-10-CM

## 2021-06-10 DIAGNOSIS — O30043 Twin pregnancy, dichorionic/diamniotic, third trimester: Secondary | ICD-10-CM

## 2021-06-11 ENCOUNTER — Inpatient Hospital Stay: Payer: Medicaid Other | Attending: Oncology | Admitting: Oncology

## 2021-06-11 ENCOUNTER — Inpatient Hospital Stay: Payer: Medicaid Other

## 2021-06-11 ENCOUNTER — Other Ambulatory Visit: Payer: Self-pay | Admitting: Maternal & Fetal Medicine

## 2021-06-11 ENCOUNTER — Other Ambulatory Visit: Payer: Self-pay

## 2021-06-11 ENCOUNTER — Ambulatory Visit (INDEPENDENT_AMBULATORY_CARE_PROVIDER_SITE_OTHER): Payer: Medicaid Other | Admitting: Advanced Practice Midwife

## 2021-06-11 ENCOUNTER — Ambulatory Visit: Payer: Medicaid Other

## 2021-06-11 ENCOUNTER — Ambulatory Visit: Payer: Medicaid Other | Attending: Obstetrics

## 2021-06-11 ENCOUNTER — Encounter: Payer: Self-pay | Admitting: Oncology

## 2021-06-11 ENCOUNTER — Encounter: Payer: Self-pay | Admitting: Advanced Practice Midwife

## 2021-06-11 VITALS — BP 100/60 | Wt 194.0 lb

## 2021-06-11 VITALS — BP 126/65 | HR 97 | Temp 97.6°F | Resp 20 | Ht 66.0 in | Wt 192.0 lb

## 2021-06-11 VITALS — BP 123/74 | HR 91 | Temp 98.1°F | Resp 18 | Wt 192.6 lb

## 2021-06-11 DIAGNOSIS — O30043 Twin pregnancy, dichorionic/diamniotic, third trimester: Secondary | ICD-10-CM | POA: Insufficient documentation

## 2021-06-11 DIAGNOSIS — O365931 Maternal care for other known or suspected poor fetal growth, third trimester, fetus 1: Secondary | ICD-10-CM | POA: Insufficient documentation

## 2021-06-11 DIAGNOSIS — N1330 Unspecified hydronephrosis: Secondary | ICD-10-CM | POA: Diagnosis not present

## 2021-06-11 DIAGNOSIS — Z833 Family history of diabetes mellitus: Secondary | ICD-10-CM | POA: Insufficient documentation

## 2021-06-11 DIAGNOSIS — O99013 Anemia complicating pregnancy, third trimester: Secondary | ICD-10-CM

## 2021-06-11 DIAGNOSIS — R5383 Other fatigue: Secondary | ICD-10-CM | POA: Diagnosis not present

## 2021-06-11 DIAGNOSIS — Z23 Encounter for immunization: Secondary | ICD-10-CM | POA: Diagnosis not present

## 2021-06-11 DIAGNOSIS — O99323 Drug use complicating pregnancy, third trimester: Secondary | ICD-10-CM | POA: Insufficient documentation

## 2021-06-11 DIAGNOSIS — Z3A33 33 weeks gestation of pregnancy: Secondary | ICD-10-CM | POA: Insufficient documentation

## 2021-06-11 DIAGNOSIS — O09293 Supervision of pregnancy with other poor reproductive or obstetric history, third trimester: Secondary | ICD-10-CM | POA: Insufficient documentation

## 2021-06-11 DIAGNOSIS — O0990 Supervision of high risk pregnancy, unspecified, unspecified trimester: Secondary | ICD-10-CM

## 2021-06-11 DIAGNOSIS — O0993 Supervision of high risk pregnancy, unspecified, third trimester: Secondary | ICD-10-CM

## 2021-06-11 DIAGNOSIS — D649 Anemia, unspecified: Secondary | ICD-10-CM | POA: Insufficient documentation

## 2021-06-11 DIAGNOSIS — Z8659 Personal history of other mental and behavioral disorders: Secondary | ICD-10-CM | POA: Insufficient documentation

## 2021-06-11 DIAGNOSIS — Z3A32 32 weeks gestation of pregnancy: Secondary | ICD-10-CM | POA: Insufficient documentation

## 2021-06-11 DIAGNOSIS — E538 Deficiency of other specified B group vitamins: Secondary | ICD-10-CM | POA: Insufficient documentation

## 2021-06-11 DIAGNOSIS — F1290 Cannabis use, unspecified, uncomplicated: Secondary | ICD-10-CM | POA: Diagnosis not present

## 2021-06-11 LAB — VITAMIN B12: Vitamin B-12: 149 pg/mL — ABNORMAL LOW (ref 180–914)

## 2021-06-11 LAB — CBC
HCT: 28 % — ABNORMAL LOW (ref 36.0–46.0)
Hemoglobin: 9 g/dL — ABNORMAL LOW (ref 12.0–15.0)
MCH: 27.8 pg (ref 26.0–34.0)
MCHC: 32.1 g/dL (ref 30.0–36.0)
MCV: 86.4 fL (ref 80.0–100.0)
Platelets: 317 10*3/uL (ref 150–400)
RBC: 3.24 MIL/uL — ABNORMAL LOW (ref 3.87–5.11)
RDW: 15.7 % — ABNORMAL HIGH (ref 11.5–15.5)
WBC: 14.1 10*3/uL — ABNORMAL HIGH (ref 4.0–10.5)
nRBC: 0.6 % — ABNORMAL HIGH (ref 0.0–0.2)

## 2021-06-11 LAB — FOLATE: Folate: 22 ng/mL (ref >5.9)

## 2021-06-11 LAB — FERRITIN: Ferritin: 11 ng/mL (ref 11–307)

## 2021-06-11 LAB — IRON AND TIBC
Iron: 92 ug/dL (ref 28–170)
Saturation Ratios: 13 % (ref 10.4–31.8)
TIBC: 685 ug/dL — ABNORMAL HIGH (ref 250–450)
UIBC: 593 ug/dL

## 2021-06-11 NOTE — Progress Notes (Signed)
ROB- TDAP/BT consent signed today, no concerns

## 2021-06-11 NOTE — Patient Instructions (Signed)
Breastfeeding Twins or Multiples Choosing to breastfeed your babies has many benefits. Breast milk is beneficial for all babies, but it is especially beneficial to multiples, who are often small at birth and need all the advantages breast milk can provide. Mothers of multiples typically produce enough milk for all their babies. How does breastfeeding benefit me? Choosing to breastfeed has many benefits. It helps improve your recovery from giving birth by: Reducing blood loss after delivery. Speeding up how quickly your uterus heals. Reducing your risk of a type of depression that happens after giving birth (postpartum depression). It increases the time before your menstrual periods return. This can help to delay pregnancy if you are not using birth control. It creates a unique bond between you and each of your babies. How does breastfeeding benefit my babies? Choosing to breastfeed your babies has many benefits: It ensures your babies get the best nutrition. It helps develop your babies' disease-fighting systems (immune systems) by providing antibodies that help fight off germs. It may lower your babies' risk for: Problems with the stomach and intestines. Allergies. Ear infections. Infections of the nose, throat, or airways (respiratory infections). Obesity. Diabetes. Sudden infant death syndrome (SIDS). What actions can I take to help with breastfeeding? Nurse as soon as possible and as often as your babies want to be nursed. This will cause your body to produce enough milk for all your babies. Work with a breastfeeding specialist (Advertising copywriter) as soon as possible. It is important that each baby can latch and feed. If your babies were born early (prematurely) and are unable to nurse, you can pump your breasts and freeze the milk until your babies are ready to feed at the breast. To make sure that your body makes enough milk, empty your breast at least 8-10 times in a 24-hour period.  Ask a lactation consultant to help you choose an effective breast pump. Also, ask for guidance in helping your babies latch on to and feed from the breast when they are ready. In addition, consider joining a support group for breastfeeding moms of twins and multiples. Breastfeeding recommendations It is up to you whether you nurse your babies together or separately. However, many mothers of multiples find that it is easier and time-saving to nurse two babies at the same time. Nursing babies together may also help establish your milk supply. Nursing two babies at the same time can be tricky at first, but it often gets easier as the babies get older and more experienced at latching on to the breast. When nursing your babies together: Ask your nurse or lactation consultant to suggest tips on positioning. There are several positions and holds that make it easier to nurse more than one baby at a time. Try placing pillows under your arms and legs and under your babies for comfort. Use a nursing pillow specially designed for multiples. You may want to switch your babies from one side to the other at alternate feedings. For example, start by feeding baby A from the right breast and baby B from the left breast. Then at the next feeding, baby A should take the left breast and baby B the right breast. This ensures that your breasts get equal amounts of stimulation. It also may allow a baby who has a stronger suck to increase the milk supply for the baby who may have a suck that is weaker. General recommendations Make sure your babies have a good latch. This helps the babies empty the breasts. It  also prevents sore nipples. If you are nursing two babies together, try nursing one baby separately if he or she is having trouble latching or sucking. You can then give that baby your full attention. If you are nursing two babies separately, try nursing them together if one of the babies is having trouble feeding. The baby  with the stronger or more effective suck will stimulate your milk to flow faster. Limit the use of bottles and pacifiers during the first 3-4 weeks of breastfeeding. Doing so may encourage more effective sucking patterns and help establish a good milk supply. You should not need supplemental feedings if you empty your breasts with each feeding. Listen for swallowing sounds and watch for swallowing while you are nursing. A lactation consultant can help you learn this. Drink enough fluid to keep your urine pale yellow. Eat a healthy diet that includes fresh fruits and vegetables, whole grains, lean meat, fish, eggs, beans, nuts, seeds, and low-fat dairy products. Follow these instructions at home: Track your babies' wet diapers Track wet diapers for the first 6 weeks to make sure each baby is getting enough milk. Signs that your babies are getting enough milk include: Wetting at least 1 or 2 diapers each in the first 24 hours after birth. Wetting at least 5 or 6 diapers per baby every 24 hours for the first week after birth. The urine should be pale yellow by 5 days after birth. Wetting 6-8 diapers per baby every 24 hours as your babies grow and develop. Track your babies' stools Track each baby's stools for the first 6 weeks to make sure each baby is getting enough milk. Here are signs that your babies are getting enough milk: By the time your babies are 15 days old, they should each have at least 3 stools in a 24-hour period. The stools should be soft and yellow. By the time your babies are 74 days old, they should each have at least 3 stools in a 24-hour period. The stools should be seedy and yellow if you are feeding your babies with breast milk only (exclusive breastfeeding). General instructions Talk with a lactation consultant if your babies do not get enough milk from breastfeeding alone. It may be possible to breastfeed part of the time and supplement feedings with donated milk or formula. Talk  to your health care provider about how much weight your babies should be gaining. Where to find more information [employer]: www.llli.org Contact a health care provider if: Your nipples are cracked or bleeding. Your breasts are red, tender, or warm. You have: Painful breasts or nipples. A swollen area on either breast. A fever or chills. Nausea or vomiting. Drainage other than breast milk from your nipples. Your have a fever. Get help right away if: Your baby is: Too sleepy to eat well. Having trouble sleeping. More than 80 week old and wetting fewer than 6 diapers in a 24-hour period. Not gaining weight by 69 days of age. Summary Breastfeeding is beneficial for twins and multiples, and for their mothers. Nurse your babies as soon as possible after birth. You can nurse two babies at one time. Work with a Advertising copywriter to assess your babies' latches, to find positioning and breastfeeding strategies that work for you, and to overcome any nursing challenges. This information is not intended to replace advice given to you by your health care provider. Make sure you discuss any questions you have with your health care provider. Document Revised: 03/26/2020 Document  Reviewed: 03/26/2020 Elsevier Patient Education  2022 [employer].

## 2021-06-11 NOTE — Progress Notes (Signed)
Routine Prenatal Care Visit  Subjective  Kendra Ward is a 25 y.o. G2P0010 at [redacted]w[redacted]d being seen today for ongoing prenatal care.  She is currently monitored for the following issues for this high-risk pregnancy and has LLQ pain; Left ovarian cyst; Twin gestation in third trimester; Supervision of high risk pregnancy, antepartum; Intrauterine growth restriction (IUGR) affecting care of mother; Anemia in pregnancy, third trimester; Abdominal pain in pregnancy, third trimester; and History of depression on their problem list.  ----------------------------------------------------------------------------------- Patient reports no complaints.  She starts iron infusions next week. She was seen by MFM today for BPP/dopplers with reassuring results. Contractions: Not present. Vag. Bleeding: None.  Movement: Present. Leaking Fluid denies.  ----------------------------------------------------------------------------------- The following portions of the patient's history were reviewed and updated as appropriate: allergies, current medications, past family history, past medical history, past social history, past surgical history and problem list. Problem list updated.  Objective  Blood pressure 100/60, weight 194 lb (88 kg), last menstrual period 10/29/2020. Pregravid weight 150 lb (68 kg) Total Weight Gain 44 lb (20 kg) Urinalysis: Urine Protein    Urine Glucose    Fetal Status: Fetal Heart Rate (bpm): 159/148   Movement: Present     General:  Alert, oriented and cooperative. Patient is in no acute distress.  Skin: Skin is warm and dry. No rash noted.   Cardiovascular: Normal heart rate noted  Respiratory: Normal respiratory effort, no problems with respiration noted  Abdomen: Soft, gravid, appropriate for gestational age. Pain/Pressure: Absent     Pelvic:  Cervical exam deferred        Extremities: Normal range of motion.  Edema: None  Mental Status: Normal mood and affect. Normal behavior.  Normal judgment and thought content.   Assessment   25 y.o. G2P0010 at [redacted]w[redacted]d by  08/05/2021, by Last Menstrual Period presenting for routine prenatal visit  Plan   March 2022 Problems (from 12/17/20 to present)     Problem Noted Resolved   History of depression 06/11/2021 by Tresea Mall, CNM No   Intrauterine growth restriction (IUGR) affecting care of mother 05/15/2021 by Conard Novak, MD No   Supervision of high risk pregnancy, antepartum 12/24/2020 by Conard Novak, MD No   Overview Addendum 03/11/2021  3:15 PM by Nadara Mustard, MD    Clinic [facility] Prenatal Labs  Dating L=8 Blood type: O/Positive/-- (04/14 1137)   Genetic Screen NIPS: desires Antibody:Negative (04/14 1137)  Anatomic [place] MFM nml Rubella: 5.03 (04/14 1137) Varicella: Imm  GTT Third trimester:  RPR: Non Reactive (04/14 1137)   Rhogam n/a HBsAg: Negative (04/14 1137)   TDaP vaccine  06/11/21                      Flu Shot: HIV: Non Reactive (04/14 1137)   Baby Food  Breast                               GBS:   Contraception  Pap: NILM 12/2020  CBB  no   CS/VBAC n/a   Support Person     DiDi Twins        Twin gestation in third trimester 12/08/2020 by Nadara Mustard, MD No   Overview Signed 03/11/2021  3:14 PM by Nadara Mustard, MD    Growth [place] monthly APT (BPP) 36 wks Delivery by 38 wks Baby ASA daily after 12 wks  Preterm labor symptoms and general obstetric precautions including but not limited to vaginal bleeding, contractions, leaking of fluid and fetal movement were reviewed in detail with the patient. Please refer to After Visit Summary for other counseling recommendations.   Return in about 1 week (around 06/18/2021) for weekly rob x3.  Tresea Mall, CNM 06/11/2021 3:32 PM

## 2021-06-11 NOTE — Procedures (Signed)
Kendra Ward 03-Aug-1996 [redacted]w[redacted]d Start time 1026 Stopped 1125   Fetus B Non-Stress Test Interpretation for 06/11/21  Indication:  DiDi Twins  Fetal Heart Rate Fetus B Mode: External Baseline Rate (B): 158 BPM Decelerations: None     Interpretation (Baby B - Fetal Testing) Nonstress Test Interpretation (Baby B): Non-reactive  Kendra Ward [birth year] [redacted]w[redacted]d  Fetus A Non-Stress Test Interpretation for 06/11/21  Indication:  DiDi Twins  Fetal Heart Rate A Mode: External Baseline Rate (A): 152 bpm Decelerations: None Multiple birth?: Yes     Interpretation (Fetal Testing) Nonstress Test Interpretation: Non-reactive Comments: Both twins just had an Ultrasound @ [facility].  BPP's 8/10. MD office to repeat NST today in office per Dr. Parke Poisson.

## 2021-06-11 NOTE — Progress Notes (Signed)
Pt here for f/u for anemia. EDD 08/05/2021 with possible induction in 4 weeks. Denies hx of anemia, iron/blood transfusions. Reports taking iron pill per OB/GYN

## 2021-06-12 ENCOUNTER — Encounter: Payer: Self-pay | Admitting: Oncology

## 2021-06-12 NOTE — Progress Notes (Signed)
[facility]  [phone] [phone]) [phone]  ID: Cecelia Graciano OB: 12/16/1995  MR#: [id number]  AOZ#:[id number]  Patient Care Team: System, Provider Not In as PCP - General  CHIEF COMPLAINT: Anemia in third trimester pregnancy.  INTERVAL HISTORY: Patient is a 25 year old female in the third trimester of pregnancy with twins and was noted to have a declining hemoglobin.  She has mild weakness and fatigue, but otherwise feels well.  She is gaining weight appropriately.  She has no neurologic complaints.  She denies any recent fevers or illnesses.  She has no chest pain, shortness of breath, cough or hemoptysis.  She denies any nausea, vomiting, constipation, or diarrhea.  She has no urinary complaints.  Patient offers no further specific complaints today.  REVIEW OF SYSTEMS:   Review of Systems  Constitutional:  Positive for malaise/fatigue. Negative for fever and weight loss.  Respiratory: Negative.  Negative for cough, hemoptysis and shortness of breath.   Cardiovascular: Negative.  Negative for chest pain and leg swelling.  Gastrointestinal: Negative.  Negative for abdominal pain.  Genitourinary: Negative.  Negative for dysuria.  Musculoskeletal: Negative.  Negative for back pain.  Skin: Negative.  Negative for rash.  Neurological: Negative.  Negative for dizziness, focal weakness, weakness and headaches.  Psychiatric/Behavioral: Negative.  The patient is not nervous/anxious.    As per HPI. Otherwise, a complete review of systems is negative.  PAST MEDICAL HISTORY: Past Medical History:  Diagnosis Date   Headache    No known health problems    Ovarian cyst 2022   L side-ruptured    PAST SURGICAL HISTORY: Past Surgical History:  Procedure Laterality Date   DIAGNOSTIC LAPAROSCOPY WITH REMOVAL OF ECTOPIC PREGNANCY N/A 11/26/2020   Procedure: DIAGNOSTIC LAPAROSCOPY;  Surgeon: Nadara Mustard, MD;  Location: [facility];  Service: Gynecology;   Laterality: N/A;    FAMILY HISTORY: Family History  Problem Relation Age of Onset   Diabetes Maternal Uncle    Diabetes Maternal Grandmother    Diabetes Maternal Grandfather     ADVANCED DIRECTIVES (Y/N):  N  HEALTH MAINTENANCE: Social History   Tobacco Use   Smoking status: Former    Types: Cigarettes    Quit date: 11/29/2020    Years since quitting: 0.5   Smokeless tobacco: Never  Vaping Use   Vaping Use: Never used  Substance Use Topics   Alcohol use: Not Currently   Drug use: Yes    Types: Marijuana    Comment: 11/29/2020     Colonoscopy:  PAP:  Bone density:  Lipid panel:  Allergies  Allergen Reactions   Latex Itching    Current Outpatient Medications  Medication Sig Dispense Refill   aspirin 81 MG chewable tablet Chew 81 mg by mouth daily.     Butalbital-APAP-Caffeine 50-325-40 MG capsule Take 1-2 capsules by mouth every 6 (six) hours as needed for headache. 30 capsule 1   ferrous sulfate 325 (65 FE) MG tablet Take 325 mg by mouth daily with breakfast.     Prenatal Vit-Fe Fumarate-FA (MULTIVITAMIN-PRENATAL) 27-0.8 MG TABS tablet Take 1 tablet by mouth daily at 12 noon.     No current facility-administered medications for this visit.    OBJECTIVE: Vitals:   06/11/21 1346  BP: 123/74  Pulse: 91  Resp: 18  Temp: 98.1 F (36.7 C)  SpO2: 100%     Body mass index is 31.09 kg/m.    ECOG FS:0 - Asymptomatic  General: Well-developed, well-nourished, no acute distress. Eyes: Pink conjunctiva, anicteric  sclera. HEENT: Normocephalic, moist mucous membranes. Lungs: No audible wheezing or coughing. Heart: Regular rate and rhythm. Abdomen: Appears appropriate for gestational age.   Musculoskeletal: No edema, cyanosis, or clubbing. Neuro: Alert, answering all questions appropriately. Cranial nerves grossly intact. Skin: No rashes or petechiae noted. Psych: Normal affect. Lymphatics: No cervical, calvicular, axillary or inguinal LAD.   LAB  RESULTS:  Lab Results  Component Value Date   NA 134 (L) 05/20/2021   K 3.9 05/20/2021   CL 108 05/20/2021   CO2 23 05/20/2021   GLUCOSE 83 05/20/2021   BUN 8 05/20/2021   CREATININE 0.57 05/20/2021   CALCIUM 8.4 (L) 05/20/2021   PROT 6.6 05/20/2021   ALBUMIN 2.9 (L) 05/20/2021   AST 24 05/20/2021   ALT 14 05/20/2021   ALKPHOS 98 05/20/2021   BILITOT 0.6 05/20/2021   GFRNONAA >60 05/20/2021    Lab Results  Component Value Date   WBC 14.1 (H) 06/11/2021   NEUTROABS 9.6 (H) 05/20/2021   HGB 9.0 (L) 06/11/2021   HCT 28.0 (L) 06/11/2021   MCV 86.4 06/11/2021   PLT 317 06/11/2021   Lab Results  Component Value Date   IRON 92 06/11/2021   TIBC 685 (H) 06/11/2021   IRONPCTSAT 13 06/11/2021   Lab Results  Component Value Date   FERRITIN 11 06/11/2021     STUDIES: [place] MFM FETAL BPP W/NONSTRESS ADD'L GEST  Result Date: 06/11/2021 ----------------------------------------------------------------------  OBSTETRICS REPORT                       (Signed Final 06/11/2021 11:57 am) ---------------------------------------------------------------------- Patient Info  ID #:       [id number]                          D.O.B.:  26-Jan-1996 (24 yrs)  Name:       Lenda Kelp              Visit Date: 06/11/2021 09:41 am ---------------------------------------------------------------------- Performed By  Attending:        Ma Rings MD         Ref. Address:     [address] [place],                                                             [place] [address]  Performed By:     Lorn Junes,           Location[facility]                    RDMS, RDCS                               [facility] at  [facility]  Referred By:      Conard Novak MD ---------------------------------------------------------------------- Orders  #  Description                            Code        Ordered By  1  [place] MFM FETAL BPP                      91478.2     Bettey Costa     W/NONSTRESS                                       BOOKER  2  [place] MFM UA CORD DOPPLER                76820.02    CORENTHIAN                                                       BOOKER  3  [place] MFM UA DOPPLER ADDL                76820.03    CORENTHIAN     GEST RE EVAL                                      BOOKER  4  [place] MFM FETAL BPP                      95621.3     Bettey Costa     W/NONSTRESS ADD'L GEST                            BOOKER ----------------------------------------------------------------------  #  Order #                     Accession #                Episode #  1  [id number]                   [id number]                 [id number]  2  [id number]                   [id number]                 [id number]  3  [id number]                   [id number]                 [id number]  4  [id number]                   [id number]                 [id number] ---------------------------------------------------------------------- Indications  Maternal care for known or suspected poor      S85.4627  fetal growth, third trimester, fetus 1 IUGR  Twin pregnancy, di/di, third trimester         O30.043  [redacted] weeks gestation of pregnancy                Z3A.32 ---------------------------------------------------------------------- Fetal Evaluation (Fetus A)  Num Of Fetuses:         2  Fetal Heart Rate(bpm):  145  Cardiac Activity:       Observed  Fetal Lie:              Lower Fetus  Presentation:           Breech  Placenta:               Posterior  Membrane Desc:      Dividing Membrane seen  Amniotic Fluid  AFI FV:      Within normal limits                              Largest Pocket(cm)                              4.1 ---------------------------------------------------------------------- Biophysical Evaluation (Fetus A)  Amniotic F.V:   Within normal limits       F. Tone:        Observed  F. Movement:    Observed                    N.S.T:          Nonreactive  F. Breathing:   Observed                   Score:          8/10 ---------------------------------------------------------------------- Gestational Age (Fetus A)  LMP:           32w 1d        Date:  10/29/20                 EDD:   08/05/21  Best:          32w 1d     Det. By:  LMP  (10/29/20)          EDD:   08/05/21 ---------------------------------------------------------------------- Doppler - Fetal Vessels (Fetus A)  Umbilical Artery   S/D     %tile      RI    %tile                             ADFV    RDFV   2.91       64    0.66       71                                No      No ---------------------------------------------------------------------- Fetal Evaluation (Fetus B)  Num Of Fetuses:         2  Fetal Heart Rate(bpm):  166  Cardiac Activity:       Observed  Fetal Lie:              Upper Fetus  Presentation:           Transverse  Placenta:               Anterior  Membrane Desc:      Dividing Membrane seen  Amniotic Fluid  AFI FV:      Within normal limits                              Largest Pocket(cm)                              6 ---------------------------------------------------------------------- Biophysical Evaluation (Fetus B)  Amniotic F.V:   Within normal limits       F. Tone:        Observed  F. Movement:    Observed                   N.S.T:          Nonreactive  F. Breathing:   Observed                   Score:          8/10 ---------------------------------------------------------------------- Gestational Age (Fetus B)  LMP:           32w 1d        Date:  10/29/20                 EDD:   08/05/21  Best:          32w 1d     Det. By:  LMP  (10/29/20)          EDD:   08/05/21 ---------------------------------------------------------------------- Doppler - Fetal Vessels (Fetus B)  Umbilical Artery   S/D     %tile      RI    %tile                             ADFV    RDFV    3.5       88    0.71       87                                No      No  ---------------------------------------------------------------------- Comments  This patient was seen due to fetal growth restriction of twin A  in a dichorionic, diamniotic twin gestation.  She denies any  problems since her last exam.  She reports feeling vigorous  fetal movements of both fetuses throughout the day.  There was normal amniotic fluid noted on today's ultrasound  exam around both twin A and twin B.  Doppler studies of the umbilical arteries performed due to  fetal growth restriction showed a normal S/D ratio for both  twin A and twin B.  There were no signs of absent or reversed  end-diastolic flow noted today in either fetus.  A biophysical profile performed today was 8 out of 10 for both  twin A and twin B.  Both fetuses had a nonreactive NST today.  The patient should have another NST performed in your  office later this afternoon during her prenatal visit.  She will return in 1 week for another umbilical artery Doppler  study and biophysical profile.  Delivery for fetal growth restriction in a dichorionic twin  gestation is usually recommended at around 36  weeks as  long as the fetal testing and umbilical artery Doppler studies  remain within normal  limits. ----------------------------------------------------------------------                   Ma Rings, MD Electronically Signed Final Report   06/11/2021 11:57 am ----------------------------------------------------------------------  [place] MFM FETAL BPP W/NONSTRESS  Result Date: 06/11/2021 ----------------------------------------------------------------------  OBSTETRICS REPORT                       (Signed Final 06/11/2021 11:57 am) ---------------------------------------------------------------------- Patient Info  ID #:       [id number]                          D.O.B.:  1996/07/31 (24 yrs)  Name:       Lenda Kelp              Visit Date: 06/11/2021 09:41 am ----------------------------------------------------------------------  Performed By  Attending:        Ma Rings MD         Ref. Address:     [address] [place],                                                             [place] [address]  Performed By:     Lorn Junes,           Location[facility]         [facility]                    RDMS, RDCS                               [facility] Care at                                                             [facility]  Referred By:      Conard Novak MD ---------------------------------------------------------------------- Orders  #  Description                           Code        Ordered By  1  [place] MFM FETAL BPP                      98338.2     Bettey Costa     W/NONSTRESS                                       Grace Bushy  2  [place] MFM UA CORD DOPPLER                76820.02    CORENTHIAN                                                       BOOKER  3  [place] MFM UA DOPPLER ADDL                76820.03    CORENTHIAN     GEST RE EVAL                                      BOOKER  4  [place] MFM FETAL BPP                      19147.8     Bettey Costa     W/NONSTRESS ADD'L GEST                            BOOKER ----------------------------------------------------------------------  #  Order #                     Accession #                Episode #  1  [id number]                   [id number]                 [id number]  2  [id number]                   [id number]                 [id number]  3  [id number]                   [id number]                 [id number]  4  [id number]                   [id number]                 [id number] ---------------------------------------------------------------------- Indications  Maternal care for known or suspected poor      O36.5931  fetal growth, third trimester, fetus 1 IUGR  Twin pregnancy, di/di, third trimester         O30.043  [redacted] weeks gestation of pregnancy                Z3A.32  ---------------------------------------------------------------------- Fetal Evaluation (Fetus A)  Num Of Fetuses:         2  Fetal Heart Rate(bpm):  145  Cardiac Activity:       Observed  Fetal Lie:              Lower Fetus  Presentation:           Breech  Placenta:               Posterior  Membrane Desc:      Dividing Membrane seen  Amniotic Fluid  AFI FV:      Within normal limits  Largest Pocket(cm)                              4.1 ---------------------------------------------------------------------- Biophysical Evaluation (Fetus A)  Amniotic F.V:   Within normal limits       F. Tone:        Observed  F. Movement:    Observed                   N.S.T:          Nonreactive  F. Breathing:   Observed                   Score:          8/10 ---------------------------------------------------------------------- Gestational Age (Fetus A)  LMP:           32w 1d        Date:  10/29/20                 EDD:   08/05/21  Best:          32w 1d     Det. By:  LMP  (10/29/20)          EDD:   08/05/21 ---------------------------------------------------------------------- Doppler - Fetal Vessels (Fetus A)  Umbilical Artery   S/D     %tile      RI    %tile                             ADFV    RDFV   2.91       64    0.66       71                                No      No ---------------------------------------------------------------------- Fetal Evaluation (Fetus B)  Num Of Fetuses:         2  Fetal Heart Rate(bpm):  166  Cardiac Activity:       Observed  Fetal Lie:              Upper Fetus  Presentation:           Transverse  Placenta:               Anterior  Membrane Desc:      Dividing Membrane seen  Amniotic Fluid  AFI FV:      Within normal limits                              Largest Pocket(cm)                              6 ---------------------------------------------------------------------- Biophysical Evaluation (Fetus B)  Amniotic F.V:   Within normal limits       F. Tone:        Observed  F.  Movement:    Observed                   N.S.T:          Nonreactive  F. Breathing:   Observed                   Score:  8/10 ---------------------------------------------------------------------- Gestational Age (Fetus B)  LMP:           32w 1d        Date:  10/29/20                 EDD:   08/05/21  Best:          32w 1d     Det. By:  LMP  (10/29/20)          EDD:   08/05/21 ---------------------------------------------------------------------- Doppler - Fetal Vessels (Fetus B)  Umbilical Artery   S/D     %tile      RI    %tile                             ADFV    RDFV    3.5       88    0.71       87                                No      No ---------------------------------------------------------------------- Comments  This patient was seen due to fetal growth restriction of twin A  in a dichorionic, diamniotic twin gestation.  She denies any  problems since her last exam.  She reports feeling vigorous  fetal movements of both fetuses throughout the day.  There was normal amniotic fluid noted on today's ultrasound  exam around both twin A and twin B.  Doppler studies of the umbilical arteries performed due to  fetal growth restriction showed a normal S/D ratio for both  twin A and twin B.  There were no signs of absent or reversed  end-diastolic flow noted today in either fetus.  A biophysical profile performed today was 8 out of 10 for both  twin A and twin B.  Both fetuses had a nonreactive NST today.  The patient should have another NST performed in your  office later this afternoon during her prenatal visit.  She will return in 1 week for another umbilical artery Doppler  study and biophysical profile.  Delivery for fetal growth restriction in a dichorionic twin  gestation is usually recommended at around 36  weeks as  long as the fetal testing and umbilical artery Doppler studies  remain within normal limits. ----------------------------------------------------------------------                    Ma Rings, MD Electronically Signed Final Report   06/11/2021 11:57 am ----------------------------------------------------------------------  [place] MFM FETAL BPP WO NON STRESS  Result Date: 06/04/2021 ----------------------------------------------------------------------  OBSTETRICS REPORT                       (Signed Final 06/04/2021 04:49 pm) ---------------------------------------------------------------------- Patient Info  ID #:       [id number]                          D.O.B.:  1996-03-12 (24 yrs)  Name:       Lenda Kelp              Visit Date: 06/04/2021 11:01 am ---------------------------------------------------------------------- Performed By  Attending:        Lin Landsman      Ref. Address:     [address], [place],                                                             [place] [address]  Performed By:     Francene Boyers           [facility]                    RDMS                                     [facility] at                                                             [facility]  Referred By:      Conard Novak MD ---------------------------------------------------------------------- Orders  #  Description                           Code        Ordered By  1  [place] MFM OB FOLLOW UP                   [phone]    YU FANG  2  [place] MFM OB FOLLOW UP ADDL              43154.00    YU FANG     GEST  3  [place] MFM UA CORD DOPPLER                76820.02    YU FANG  4  [place] MFM FETAL BPP WO NON               76819.01    YU FANG     STRESS  5  [place] MFM FETAL BPP WO NST               86761.9     YU FANG     ADDL GESTATION ----------------------------------------------------------------------  #  Order #                     Accession #                Episode #  1  [id number]                   [id number]                 [id number]  2  [id number]                    [id number]                 [id number]  3  [id number]                   [id number]                 [id number]  4  [id number]                   [id number]                 [id number]  5  [id number]                   [id number]                 [id number] ---------------------------------------------------------------------- Indications  [redacted] weeks gestation of pregnancy                Z3A.36  Twin pregnancy, di/di, third trimester         O30.043 ---------------------------------------------------------------------- Fetal Evaluation (Fetus A)  Num Of Fetuses:         2  Fetal Heart Rate(bpm):  132  Cardiac Activity:       Observed  Presentation:           Breech  Placenta:               Anterior  P. Cord Insertion:      Previously Visualized                              Largest Pocket(cm)                              4.2 ---------------------------------------------------------------------- Biophysical Evaluation (Fetus A)  Amniotic F.V:   Within normal limits       F. Tone:        Observed  F. Movement:    Observed                   Score:          8/8  F. Breathing:   Observed ---------------------------------------------------------------------- Biometry (Fetus A)  BPD:      80.5  mm     G. Age:  32w 2d         75  %    CI:        75.22   %    70 - 86                                                          FL/HC:      18.5   %    19.3 - 21.3  HC:      294.4  mm     G. Age:  32w 4d         51  %    HC/AC:      1.28        0.96 - 1.17  AC:      230.2  mm     G. Age:  27w 3d        < 1  %    FL/BPD:     67.7   %    71 - 87  FL:       54.5  mm     G. Age:  28w 6d        1.6  %    FL/AC:      23.7   %    20 - 24  HUM:      50.4  mm     G. Age:  29w 4d         18  %  LV:          4  mm  Est. FW:    1262  gm    2 lb 13 oz     1.1  %     FW Discordancy        21  % ---------------------------------------------------------------------- Gestational Age (Fetus A)  LMP:           31w 1d        Date:  10/29/20                 EDD:    08/05/21  U/S Today:     30w 2d                                        EDD:   08/11/21  Best:          31w 1d     Det. By:  LMP  (10/29/20)          EDD:   08/05/21 ---------------------------------------------------------------------- Anatomy (Fetus A)  Cranium:               Appears normal         Aortic Arch:            Previously seen  Cavum:                 Previously seen        Ductal Arch:            Previously seen  Ventricles:            Appears normal         Diaphragm:              Appears normal  Choroid Plexus:        Previously seen        Stomach:                Appears normal, left                                                                        sided  Cerebellum:            Previously seen        Abdomen:                Previously seen  Posterior Fossa:       Previously seen        Abdominal Wall:  Previously seen  Nuchal Fold:           Previously seen        Cord Vessels:           Previously seen  Face:                  Orbits and profile     Kidneys:                Previously seen                         previously seen  Lips:                  Previously seen        Bladder:                Appears normal  Heart:                 Appears normal         Spine:                  Previously seen                         (4CH, axis, and                         situs)  RVOT:                  Appears normal         Upper Extremities:      Previously seen  LVOT:                  Previously seen        Lower Extremities:      Previously seen ---------------------------------------------------------------------- Doppler - Fetal Vessels (Fetus A)  Umbilical Artery   S/D     %tile      RI    %tile   2.83       55    0.65       61 ---------------------------------------------------------------------- Fetal Evaluation (Fetus B)  Num Of Fetuses:         2  Fetal Heart Rate(bpm):  147  Cardiac Activity:       Observed  Presentation:           Transverse, head to maternal right  Placenta:                Anterior  P. Cord Insertion:      Previously Visualized                              Largest Pocket(cm)                              4 ---------------------------------------------------------------------- Biophysical Evaluation (Fetus B)  Amniotic F.V:   Within normal limits       F. Tone:        Observed  F. Movement:    Observed                   Score:          8/8  F. Breathing:   Observed ---------------------------------------------------------------------- Biometry (Fetus B)  BPD:  81.4  mm     G. Age:  32w 5d         84  %    CI:        73.53   %    70 - 86                                                          FL/HC:      19.4   %    19.3 - 21.3  HC:      301.6  mm     G. Age:  33w 3d         77  %    HC/AC:      1.18        0.96 - 1.17  AC:      255.8  mm     G. Age:  29w 5d         12  %    FL/BPD:     72.0   %    71 - 87  FL:       58.6  mm     G. Age:  30w 4d         22  %    FL/AC:      22.9   %    20 - 24  HUM:        54  mm     G. Age:  31w 3d         58  %  Est. FW:    1604  gm      3 lb 9 oz     22  %     FW Discordancy     0 \ 21 % ---------------------------------------------------------------------- Gestational Age (Fetus B)  LMP:           31w 1d        Date:  10/29/20                 EDD:   08/05/21  U/S Today:     31w 4d                                        EDD:   08/02/21  Best:          31w 1d     Det. By:  LMP  (10/29/20)          EDD:   08/05/21 ---------------------------------------------------------------------- Anatomy (Fetus B)  Cranium:               Appears normal         Aortic Arch:            Previously seen  Cavum:                 Previously seen        Ductal Arch:            Previously seen  Ventricles:            Appears normal         Diaphragm:              Appears normal  Choroid Plexus:        Previously seen        Stomach:                Appears normal, left                                                                        sided  Cerebellum:             Previously seen        Abdomen:                Previously seen  Posterior Fossa:       Previously seen        Abdominal Wall:         Previously seen  Nuchal Fold:           Previously seen        Cord Vessels:           Previously seen  Face:                  Orbits and profile     Kidneys:                Previously seen                         previously seen  Lips:                  Previously seen        Bladder:                Appears normal  Heart:                 Appears normal         Spine:                  Previously seen                         (4CH, axis, and                         situs)  RVOT:                  Appears normal         Upper Extremities:      Previously seen  LVOT:                  Appears normal         Lower Extremities:      Previously seen ---------------------------------------------------------------------- Doppler - Fetal Vessels (Fetus B)  Umbilical Artery   S/D     %tile      RI    %tile                             ADFV    RDFV   2.67       46    0.63       51  No      No ---------------------------------------------------------------------- Impression  Follow up growth for dichorionic diamnoitic twin pregnancy  with FGR of TwIn A  Twin A normal stomach, amniotic fluid, bladder and BPP 8/8-  EFW 1.1% Breech presentation.  Twin B normal stomach, amniotic fluid, bladder and BPP 8/8-  EFW 22% head maternal right, transverse  UA Dopplers are normal in both twin A and B, without  evidence of AEDF or REDF.  Twin discordance is 21%.  I reviewed today's exam and discuss the decreasing growth  in Twin A.  Ms. Fagerstrom not good fetal movement.  Given the EFW of 1.1% I recommend continued weekly  testing with plan for delivery during the 36th week. ---------------------------------------------------------------------- Recommendations  Continue weekly testing  Repeat growth in 3 weeks. ----------------------------------------------------------------------                Lin Landsman, MD Electronically Signed Final Report   06/04/2021 04:49 pm ----------------------------------------------------------------------  [place] MFM OB FOLLOW UP  Result Date: 06/04/2021 ----------------------------------------------------------------------  OBSTETRICS REPORT                       (Signed Final 06/04/2021 04:49 pm) ---------------------------------------------------------------------- Patient Info  ID #:       [id number]                          D.O.B.:  May 02, 1996 (24 yrs)  Name:       Lenda Kelp              Visit Date: 06/04/2021 11:01 am ---------------------------------------------------------------------- Performed By  Attending:        Lin Landsman      Ref. Address:     [address]                    MD                                                             [place], [place],                                                             [place] [address]  Performed By:     Francene Boyers           [facility]                    RD[facility]                                     [facility] at                                                             [facility]  Referred  By:      Conard Novak MD ---------------------------------------------------------------------- Orders  #  Description                           Code        Ordered By  1  [place] MFM OB FOLLOW UP                   [phone]    YU FANG  2  [place] MFM OB FOLLOW UP ADDL              76816.02    YU FANG     GEST  3  [place] MFM UA CORD DOPPLER                76820.02    YU FANG  4  [place] MFM FETAL BPP WO NON               76819.01    YU FANG     STRESS  5  [place] MFM FETAL BPP WO NST               08657.8     YU FANG     ADDL GESTATION ----------------------------------------------------------------------  #  Order #                     Accession #                Episode #  1  [id number]                   [id number]                 [id number]  2  [id number]                    [id number]                 [id number]  3  [id number]                   [id number]                 [id number]  4  [id number]                   [id number]                 [id number]  5  [id number]                   [id number]                 [id number] ---------------------------------------------------------------------- Indications  [redacted] weeks gestation of pregnancy                Z3A.31  Twin pregnancy, di/di, third trimester         O30.043 ---------------------------------------------------------------------- Fetal Evaluation (Fetus A)  Num Of Fetuses:         2  Fetal Heart Rate(bpm):  132  Cardiac Activity:       Observed  Presentation:           Breech  Placenta:               Anterior  P. Cord Insertion:      Previously Visualized  Largest Pocket(cm)                              4.2 ---------------------------------------------------------------------- Biophysical Evaluation (Fetus A)  Amniotic F.V:   Within normal limits       F. Tone:        Observed  F. Movement:    Observed                   Score:          8/8  F. Breathing:   Observed ---------------------------------------------------------------------- Biometry (Fetus A)  BPD:      80.5  mm     G. Age:  32w 2d         75  %    CI:        75.22   %    70 - 86                                                          FL/HC:      18.5   %    19.3 - 21.3  HC:      294.4  mm     G. Age:  32w 4d         51  %    HC/AC:      1.28        0.96 - 1.17  AC:      230.2  mm     G. Age:  27w 3d        < 1  %    FL/BPD:     67.7   %    71 - 87  FL:       54.5  mm     G. Age:  28w 6d        1.6  %    FL/AC:      23.7   %    20 - 24  HUM:      50.4  mm     G. Age:  29w 4d         18  %  LV:          4  mm  Est. FW:    1262  gm    2 lb 13 oz     1.1  %     FW Discordancy        21  % ---------------------------------------------------------------------- Gestational Age (Fetus A)  LMP:           31w 1d        Date:  10/29/20                 EDD:    08/05/21  U/S Today:     30w 2d                                        EDD:   08/11/21  Best:          31w 1d     Det. By:  LMP  (10/29/20)          EDD:   08/05/21 ----------------------------------------------------------------------  Anatomy (Fetus A)  Cranium:               Appears normal         Aortic Arch:            Previously seen  Cavum:                 Previously seen        Ductal Arch:            Previously seen  Ventricles:            Appears normal         Diaphragm:              Appears normal  Choroid Plexus:        Previously seen        Stomach:                Appears normal, left                                                                        sided  Cerebellum:            Previously seen        Abdomen:                Previously seen  Posterior Fossa:       Previously seen        Abdominal Wall:         Previously seen  Nuchal Fold:           Previously seen        Cord Vessels:           Previously seen  Face:                  Orbits and profile     Kidneys:                Previously seen                         previously seen  Lips:                  Previously seen        Bladder:                Appears normal  Heart:                 Appears normal         Spine:                  Previously seen                         (4CH, axis, and                         situs)  RVOT:                  Appears normal         Upper Extremities:      Previously seen  LVOT:  Previously seen        Lower Extremities:      Previously seen ---------------------------------------------------------------------- Doppler - Fetal Vessels (Fetus A)  Umbilical Artery   S/D     %tile      RI    %tile   2.83       55    0.65       61 ---------------------------------------------------------------------- Fetal Evaluation (Fetus B)  Num Of Fetuses:         2  Fetal Heart Rate(bpm):  147  Cardiac Activity:       Observed  Presentation:           Transverse, head to maternal right  Placenta:                Anterior  P. Cord Insertion:      Previously Visualized                              Largest Pocket(cm)                              4 ---------------------------------------------------------------------- Biophysical Evaluation (Fetus B)  Amniotic F.V:   Within normal limits       F. Tone:        Observed  F. Movement:    Observed                   Score:          8/8  F. Breathing:   Observed ---------------------------------------------------------------------- Biometry (Fetus B)  BPD:      81.4  mm     G. Age:  32w 5d         84  %    CI:        73.53   %    70 - 86                                                          FL/HC:      19.4   %    19.3 - 21.3  HC:      301.6  mm     G. Age:  33w 3d         77  %    HC/AC:      1.18        0.96 - 1.17  AC:      255.8  mm     G. Age:  29w 5d         12  %    FL/BPD:     72.0   %    71 - 87  FL:       58.6  mm     G. Age:  30w 4d         22  %    FL/AC:      22.9   %    20 - 24  HUM:        54  mm     G. Age:  31w 3d         58  %  Est. FW:    1604  gm  3 lb 9 oz     22  %     FW Discordancy     0 \ 21 % ---------------------------------------------------------------------- Gestational Age (Fetus B)  LMP:           31w 1d        Date:  10/29/20                 EDD:   08/05/21  U/S Today:     31w 4d                                        EDD:   08/02/21  Best:          31w 1d     Det. By:  LMP  (10/29/20)          EDD:   08/05/21 ---------------------------------------------------------------------- Anatomy (Fetus B)  Cranium:               Appears normal         Aortic Arch:            Previously seen  Cavum:                 Previously seen        Ductal Arch:            Previously seen  Ventricles:            Appears normal         Diaphragm:              Appears normal  Choroid Plexus:        Previously seen        Stomach:                Appears normal, left                                                                        sided  Cerebellum:             Previously seen        Abdomen:                Previously seen  Posterior Fossa:       Previously seen        Abdominal Wall:         Previously seen  Nuchal Fold:           Previously seen        Cord Vessels:           Previously seen  Face:                  Orbits and profile     Kidneys:                Previously seen                         previously seen  Lips:                  Previously seen  Bladder:                Appears normal  Heart:                 Appears normal         Spine:                  Previously seen                         (4CH, axis, and                         situs)  RVOT:                  Appears normal         Upper Extremities:      Previously seen  LVOT:                  Appears normal         Lower Extremities:      Previously seen ---------------------------------------------------------------------- Doppler - Fetal Vessels (Fetus B)  Umbilical Artery   S/D     %tile      RI    %tile                             ADFV    RDFV   2.67       46    0.63       51                                No      No ---------------------------------------------------------------------- Impression  Follow up growth for dichorionic diamnoitic twin pregnancy  with FGR of TwIn A  Twin A normal stomach, amniotic fluid, bladder and BPP 8/8-  EFW 1.1% Breech presentation.  Twin B normal stomach, amniotic fluid, bladder and BPP 8/8-  EFW 22% head maternal right, transverse  UA Dopplers are normal in both twin A and B, without  evidence of AEDF or REDF.  Twin discordance is 21%.  I reviewed today's exam and discuss the decreasing growth  in Twin A.  Ms. Helin not good fetal movement.  Given the EFW of 1.1% I recommend continued weekly  testing with plan for delivery during the 36th week. ---------------------------------------------------------------------- Recommendations  Continue weekly testing  Repeat growth in 3 weeks. ----------------------------------------------------------------------                Lin Landsman, MD Electronically Signed Final Report   06/04/2021 04:49 pm ----------------------------------------------------------------------  [place] MFM OB FOLLOW UP ADDL GEST  Result Date: 06/04/2021 ----------------------------------------------------------------------  OBSTETRICS REPORT                       (Signed Final 06/04/2021 04:49 pm) ---------------------------------------------------------------------- Patient Info  ID #:       [id number]                          D.O.B.:  1996-02-28 (24 yrs)  Name:       Lenda Kelp              Visit Date: 06/04/2021 11:01 am ---------------------------------------------------------------------- Performed By  Attending:        Lin Landsman  Ref. Address:     [address]                    MD                                                             [place], [place],                                                             [place] [address]  Performed By:     Francene Boyers           [facility]         [facility]                    RD[facility]                                     [facility] at                                                             [facility]  Referred By:      Conard Novak MD ---------------------------------------------------------------------- Orders  #  Description                           Code        Ordered By  1  [place] MFM OB FOLLOW UP                   [phone]    YU FANG  2  [place] MFM OB FOLLOW UP ADDL              81191.47    YU FANG     GEST  3  [place] MFM UA CORD DOPPLER                76820.02    YU FANG  4  [place] MFM FETAL BPP WO NON               76819.01    YU FANG     STRESS  5  [place] MFM FETAL BPP WO NST               82956.2     YU FANG     ADDL GESTATION ----------------------------------------------------------------------  #  Order #                     Accession #                Episode #  1  [id number]  [id number]                 [id number]  2  [id number]                    [id number]                 [id number]  3  [id number]                   [id number]                 [id number]  4  [id number]                   [id number]                 [id number]  5  [id number]                   [id number]                 [id number] ---------------------------------------------------------------------- Indications  [redacted] weeks gestation of pregnancy                Z3A.66  Twin pregnancy, di/di, third trimester         O30.043 ---------------------------------------------------------------------- Fetal Evaluation (Fetus A)  Num Of Fetuses:         2  Fetal Heart Rate(bpm):  132  Cardiac Activity:       Observed  Presentation:           Breech  Placenta:               Anterior  P. Cord Insertion:      Previously Visualized                              Largest Pocket(cm)                              4.2 ---------------------------------------------------------------------- Biophysical Evaluation (Fetus A)  Amniotic F.V:   Within normal limits       F. Tone:        Observed  F. Movement:    Observed                   Score:          8/8  F. Breathing:   Observed ---------------------------------------------------------------------- Biometry (Fetus A)  BPD:      80.5  mm     G. Age:  32w 2d         75  %    CI:        75.22   %    70 - 86                                                          FL/HC:      18.5   %    19.3 - 21.3  HC:      294.4  mm     G. Age:  32w 4d         51  %    HC/AC:      1.28  0.96 - 1.17  AC:      230.2  mm     G. Age:  27w 3d        < 1  %    FL/BPD:     67.7   %    71 - 87  FL:       54.5  mm     G. Age:  28w 6d        1.6  %    FL/AC:      23.7   %    20 - 24  HUM:      50.4  mm     G. Age:  29w 4d         18  %  LV:          4  mm  Est. FW:    1262  gm    2 lb 13 oz     1.1  %     FW Discordancy        21  % ---------------------------------------------------------------------- Gestational Age (Fetus A)  LMP:           31w 1d        Date:  10/29/20                 EDD:    08/05/21  U/S Today:     30w 2d                                        EDD:   08/11/21  Best:          31w 1d     Det. By:  LMP  (10/29/20)          EDD:   08/05/21 ---------------------------------------------------------------------- Anatomy (Fetus A)  Cranium:               Appears normal         Aortic Arch:            Previously seen  Cavum:                 Previously seen        Ductal Arch:            Previously seen  Ventricles:            Appears normal         Diaphragm:              Appears normal  Choroid Plexus:        Previously seen        Stomach:                Appears normal, left                                                                        sided  Cerebellum:            Previously seen        Abdomen:                Previously seen  Posterior Fossa:  Previously seen        Abdominal Wall:         Previously seen  Nuchal Fold:           Previously seen        Cord Vessels:           Previously seen  Face:                  Orbits and profile     Kidneys:                Previously seen                         previously seen  Lips:                  Previously seen        Bladder:                Appears normal  Heart:                 Appears normal         Spine:                  Previously seen                         (4CH, axis, and                         situs)  RVOT:                  Appears normal         Upper Extremities:      Previously seen  LVOT:                  Previously seen        Lower Extremities:      Previously seen ---------------------------------------------------------------------- Doppler - Fetal Vessels (Fetus A)  Umbilical Artery   S/D     %tile      RI    %tile   2.83       55    0.65       61 ---------------------------------------------------------------------- Fetal Evaluation (Fetus B)  Num Of Fetuses:         2  Fetal Heart Rate(bpm):  147  Cardiac Activity:       Observed  Presentation:           Transverse, head to maternal right  Placenta:                Anterior  P. Cord Insertion:      Previously Visualized                              Largest Pocket(cm)                              4 ---------------------------------------------------------------------- Biophysical Evaluation (Fetus B)  Amniotic F.V:   Within normal limits       F. Tone:        Observed  F. Movement:    Observed                   Score:  8/8  F. Breathing:   Observed ---------------------------------------------------------------------- Biometry (Fetus B)  BPD:      81.4  mm     G. Age:  32w 5d         84  %    CI:        73.53   %    70 - 86                                                          FL/HC:      19.4   %    19.3 - 21.3  HC:      301.6  mm     G. Age:  33w 3d         77  %    HC/AC:      1.18        0.96 - 1.17  AC:      255.8  mm     G. Age:  29w 5d         12  %    FL/BPD:     72.0   %    71 - 87  FL:       58.6  mm     G. Age:  30w 4d         22  %    FL/AC:      22.9   %    20 - 24  HUM:        54  mm     G. Age:  31w 3d         58  %  Est. FW:    1604  gm      3 lb 9 oz     22  %     FW Discordancy     0 \ 21 % ---------------------------------------------------------------------- Gestational Age (Fetus B)  LMP:           31w 1d        Date:  10/29/20                 EDD:   08/05/21  U/S Today:     31w 4d                                        EDD:   08/02/21  Best:          31w 1d     Det. By:  LMP  (10/29/20)          EDD:   08/05/21 ---------------------------------------------------------------------- Anatomy (Fetus B)  Cranium:               Appears normal         Aortic Arch:            Previously seen  Cavum:                 Previously seen        Ductal Arch:            Previously seen  Ventricles:            Appears normal  Diaphragm:              Appears normal  Choroid Plexus:        Previously seen        Stomach:                Appears normal, left                                                                        sided  Cerebellum:             Previously seen        Abdomen:                Previously seen  Posterior Fossa:       Previously seen        Abdominal Wall:         Previously seen  Nuchal Fold:           Previously seen        Cord Vessels:           Previously seen  Face:                  Orbits and profile     Kidneys:                Previously seen                         previously seen  Lips:                  Previously seen        Bladder:                Appears normal  Heart:                 Appears normal         Spine:                  Previously seen                         (4CH, axis, and                         situs)  RVOT:                  Appears normal         Upper Extremities:      Previously seen  LVOT:                  Appears normal         Lower Extremities:      Previously seen ---------------------------------------------------------------------- Doppler - Fetal Vessels (Fetus B)  Umbilical Artery   S/D     %tile      RI    %tile                             ADFV    RDFV   2.67       46    0.63  51                                No      No ---------------------------------------------------------------------- Impression  Follow up growth for dichorionic diamnoitic twin pregnancy  with FGR of TwIn A  Twin A normal stomach, amniotic fluid, bladder and BPP 8/8-  EFW 1.1% Breech presentation.  Twin B normal stomach, amniotic fluid, bladder and BPP 8/8-  EFW 22% head maternal right, transverse  UA Dopplers are normal in both twin A and B, without  evidence of AEDF or REDF.  Twin discordance is 21%.  I reviewed today's exam and discuss the decreasing growth  in Twin A.  Ms. Epperly not good fetal movement.  Given the EFW of 1.1% I recommend continued weekly  testing with plan for delivery during the 36th week. ---------------------------------------------------------------------- Recommendations  Continue weekly testing  Repeat growth in 3 weeks. ----------------------------------------------------------------------                Lin Landsman, MD Electronically Signed Final Report   06/04/2021 04:49 pm ----------------------------------------------------------------------  [place] MFM OB LIMITED  Result Date: 05/26/2021 ----------------------------------------------------------------------  OBSTETRICS REPORT                       (Signed Final 05/26/2021 02:38 pm) ---------------------------------------------------------------------- Patient Info  ID #:       [id number]                          D.O.B.:  18-Aug-1996 (24 yrs)  Name:       Lenda Kelp              Visit Date: 05/26/2021 11:24 am ---------------------------------------------------------------------- Performed By  Attending:        Ma Rings MD         Ref. Address:     [address] [place],                                                             [place] [address]  Performed By:     Lorn Junes,           Location[facility]         [facility]                    RD[facility], [facility]                               [facility] at  [facility]  Referred By:      Conard Novak MD ---------------------------------------------------------------------- Orders  #  Description                           Code        Ordered By  1  [place] MFM UA CORD DOPPLER                76820.02    RAVI SHANKAR  2  [place] MFM UA ADDL GEST                   76820.01    RAVI SHANKAR  3  [place] MFM OB LIMITED                     76815.01    YU FANG ----------------------------------------------------------------------  #  Order #                     Accession #                Episode #  1  [id number]                   [id number]                 [id number]  2  [id number]                   [id number]                 [id number]  3  [id number]                   [id number]                 [id number] ----------------------------------------------------------------------  Indications  Maternal care for known or suspected poor      O36.5931  fetal growth, third trimester, fetus 1 IUGR  Twin pregnancy, di/di, third trimester         O30.043  [redacted] weeks gestation of pregnancy                Z3A.29 ---------------------------------------------------------------------- Fetal Evaluation (Fetus A)  Num Of Fetuses:         2  Fetal Heart Rate(bpm):  124  Cardiac Activity:       Observed  Fetal Lie:              Lower Fetus  Presentation:           Breech  Placenta:               Posterior  Amniotic Fluid  AFI FV:      Within normal limits                              Largest Pocket(cm)                              5.1 ---------------------------------------------------------------------- Gestational Age (Fetus A)  LMP:           29w 6d        Date:  10/29/20                 EDD:  08/05/21  Best:          29w 6d     Det. By:  LMP  (10/29/20)          EDD:   08/05/21 ---------------------------------------------------------------------- Anatomy (Fetus A)  Cranium:               Appears normal         Aortic Arch:            Previously seen  Cavum:                 Previously seen        Ductal Arch:            Previously seen  Ventricles:            Previously seen        Diaphragm:              Previously seen  Choroid Plexus:        Previously seen        Stomach:                Appears normal, left                                                                        sided  Cerebellum:            Previously seen        Abdomen:                Appears normal  Posterior Fossa:       Previously seen        Abdominal Wall:         Previously seen  Nuchal Fold:           Not applicable (>20    Cord Vessels:           Appears normal ([redacted]                         wks GA)                                        vessel cord)  Face:                  Orbits and profile     Kidneys:                Appear normal                         previously seen  Lips:                  Previously seen        Bladder:                 Appears normal  Heart:                 Previously seen        Spine:  Previously seen  RVOT:                  Previously seen        Upper Extremities:      Previously seen  LVOT:                  Previously seen        Lower Extremities:      Previously seen ---------------------------------------------------------------------- Doppler - Fetal Vessels (Fetus A)  Umbilical Artery   S/D     %tile      RI    %tile                             ADFV    RDFV    2.1        9    0.52      4.8                                No      No ---------------------------------------------------------------------- Fetal Evaluation (Fetus B)  Num Of Fetuses:         2  Fetal Heart Rate(bpm):  144  Cardiac Activity:       Observed  Fetal Lie:              Upper Fetus  Presentation:           Transverse, head to maternal right  Placenta:               Anterior  Amniotic Fluid  AFI FV:      Within normal limits                              Largest Pocket(cm)                              4.5 ---------------------------------------------------------------------- Gestational Age (Fetus B)  LMP:           29w 6d        Date:  10/29/20                 EDD:   08/05/21  Best:          29w 6d     Det. By:  LMP  (10/29/20)          EDD:   08/05/21 ---------------------------------------------------------------------- Anatomy (Fetus B)  Cranium:               Previously seen        Aortic Arch:            Previously seen  Cavum:                 Previously seen        Ductal Arch:            Previously seen  Ventricles:            Previously seen        Diaphragm:              Previously seen  Choroid Plexus:        Previously seen        Stomach:  Appears normal, left                                                                        sided  Cerebellum:            Previously seen        Abdomen:                Appears normal  Posterior Fossa:       Previously seen        Abdominal Wall:          Previously seen  Nuchal Fold:           Not applicable (>20    Cord Vessels:           Appears normal ([redacted]                         wks GA)                                        vessel cord)  Face:                  Orbits and profile     Kidneys:                Appear normal                         previously seen  Lips:                  Seen on prior scan     Bladder:                Appears normal  Heart:                 Appears normal         Spine:                  Previously seen                         (4CH, axis, and                         situs)  RVOT:                  Previously seen        Upper Extremities:      Previously seen  LVOT:                  Previously seen        Lower Extremities:      Previously seen ---------------------------------------------------------------------- Doppler - Fetal Vessels (Fetus B)  Umbilical Artery   S/D     %tile      RI    %tile                             ADFV    RDFV   3.58  83    0.72       84                                No      No ---------------------------------------------------------------------- Comments  This patient was seen for a follow-up ultrasound exam due to  IUGR of twin A in a dichorionic twin gestation.  The fetal  abdominal circumference measurements for twin A were less  than the 10th percentile during her last exam.  She denies  any problems since her last exam and reports feeling fetal  movements of both fetuses throughout the day.  There was normal amniotic fluid noted around both twin A  and twin B.  Doppler studies of the umbilical arteries performed for both  twin A and twin B continues to show normal forward flow.  There were no signs of absent or reversed end-diastolic flow  noted in either fetus.  She will return in 1 week for a biophysical profile, umbilical  artery Doppler studies, and growth scan.  Fetal kick count instructions were reviewed. ----------------------------------------------------------------------                    Ma Rings, MD Electronically Signed Final Report   05/26/2021 02:38 pm ----------------------------------------------------------------------  [place] MFM UA ADDL GEST  Result Date: 05/26/2021 ----------------------------------------------------------------------  OBSTETRICS REPORT                       (Signed Final 05/26/2021 02:38 pm) ---------------------------------------------------------------------- Patient Info  ID #:       [id number]                          D.O.B.:  April 15, 1996 (24 yrs)  Name:       Lenda Kelp              Visit Date: 05/26/2021 11:24 am ---------------------------------------------------------------------- Performed By  Attending:        Ma Rings MD         Ref. Address:     [address] [place],                                                             [place] [address]  Performed By:     Lorn Junes,           [facility]                    RDMS, RDCS                               [facility] at  [facility]  Referred By:      Conard Novak MD ---------------------------------------------------------------------- Orders  #  Description                           Code        Ordered By  1  [place] MFM UA CORD DOPPLER                76820.02    RAVI SHANKAR  2  [place] MFM UA ADDL GEST                   76820.01    RAVI SHANKAR  3  [place] MFM OB LIMITED                     76815.01    YU FANG ----------------------------------------------------------------------  #  Order #                     Accession #                Episode #  1  [id number]                   [id number]                 [id number]  2  [id number]                   [id number]                 [id number]  3  [id number]                   [id number]                 [id number] ---------------------------------------------------------------------- Indications   Maternal care for known or suspected poor      O36.5931  fetal growth, third trimester, fetus 1 IUGR  Twin pregnancy, di/di, third trimester         O30.043  [redacted] weeks gestation of pregnancy                Z3A.29 ---------------------------------------------------------------------- Fetal Evaluation (Fetus A)  Num Of Fetuses:         2  Fetal Heart Rate(bpm):  124  Cardiac Activity:       Observed  Fetal Lie:              Lower Fetus  Presentation:           Breech  Placenta:               Posterior  Amniotic Fluid  AFI FV:      Within normal limits                              Largest Pocket(cm)                              5.1 ---------------------------------------------------------------------- Gestational Age (Fetus A)  LMP:           29w 6d        Date:  10/29/20                 EDD:  08/05/21  Best:          29w 6d     Det. By:  LMP  (10/29/20)          EDD:   08/05/21 ---------------------------------------------------------------------- Anatomy (Fetus A)  Cranium:               Appears normal         Aortic Arch:            Previously seen  Cavum:                 Previously seen        Ductal Arch:            Previously seen  Ventricles:            Previously seen        Diaphragm:              Previously seen  Choroid Plexus:        Previously seen        Stomach:                Appears normal, left                                                                        sided  Cerebellum:            Previously seen        Abdomen:                Appears normal  Posterior Fossa:       Previously seen        Abdominal Wall:         Previously seen  Nuchal Fold:           Not applicable (>20    Cord Vessels:           Appears normal ([redacted]                         wks GA)                                        vessel cord)  Face:                  Orbits and profile     Kidneys:                Appear normal                         previously seen  Lips:                  Previously seen        Bladder:                 Appears normal  Heart:                 Previously seen        Spine:  Previously seen  RVOT:                  Previously seen        Upper Extremities:      Previously seen  LVOT:                  Previously seen        Lower Extremities:      Previously seen ---------------------------------------------------------------------- Doppler - Fetal Vessels (Fetus A)  Umbilical Artery   S/D     %tile      RI    %tile                             ADFV    RDFV    2.1        9    0.52      4.8                                No      No ---------------------------------------------------------------------- Fetal Evaluation (Fetus B)  Num Of Fetuses:         2  Fetal Heart Rate(bpm):  144  Cardiac Activity:       Observed  Fetal Lie:              Upper Fetus  Presentation:           Transverse, head to maternal right  Placenta:               Anterior  Amniotic Fluid  AFI FV:      Within normal limits                              Largest Pocket(cm)                              4.5 ---------------------------------------------------------------------- Gestational Age (Fetus B)  LMP:           29w 6d        Date:  10/29/20                 EDD:   08/05/21  Best:          29w 6d     Det. By:  LMP  (10/29/20)          EDD:   08/05/21 ---------------------------------------------------------------------- Anatomy (Fetus B)  Cranium:               Previously seen        Aortic Arch:            Previously seen  Cavum:                 Previously seen        Ductal Arch:            Previously seen  Ventricles:            Previously seen        Diaphragm:              Previously seen  Choroid Plexus:        Previously seen        Stomach:  Appears normal, left                                                                        sided  Cerebellum:            Previously seen        Abdomen:                Appears normal  Posterior Fossa:       Previously seen        Abdominal Wall:         Previously seen   Nuchal Fold:           Not applicable (>20    Cord Vessels:           Appears normal ([redacted]                         wks GA)                                        vessel cord)  Face:                  Orbits and profile     Kidneys:                Appear normal                         previously seen  Lips:                  Seen on prior scan     Bladder:                Appears normal  Heart:                 Appears normal         Spine:                  Previously seen                         (4CH, axis, and                         situs)  RVOT:                  Previously seen        Upper Extremities:      Previously seen  LVOT:                  Previously seen        Lower Extremities:      Previously seen ---------------------------------------------------------------------- Doppler - Fetal Vessels (Fetus B)  Umbilical Artery   S/D     %tile      RI    %tile                             ADFV    RDFV   3.58  83    0.72       84                                No      No ---------------------------------------------------------------------- Comments  This patient was seen for a follow-up ultrasound exam due to  IUGR of twin A in a dichorionic twin gestation.  The fetal  abdominal circumference measurements for twin A were less  than the 10th percentile during her last exam.  She denies  any problems since her last exam and reports feeling fetal  movements of both fetuses throughout the day.  There was normal amniotic fluid noted around both twin A  and twin B.  Doppler studies of the umbilical arteries performed for both  twin A and twin B continues to show normal forward flow.  There were no signs of absent or reversed end-diastolic flow  noted in either fetus.  She will return in 1 week for a biophysical profile, umbilical  artery Doppler studies, and growth scan.  Fetal kick count instructions were reviewed. ----------------------------------------------------------------------                   Ma RingsVictor Fang, MD  Electronically Signed Final Report   05/26/2021 02:38 pm ----------------------------------------------------------------------  US MFM UA CORD DOPPLER  Result Date: 06/11/2021 ----------------------------------------------------------------------  OBSTETRICS REPORT                       (Signed Final 06/11/2021 11:57 am) ---------------------------------------------------------------------- Patient Info  ID #:       [id number]                          D.O.B.:  07-30-1996 (24 yrs)  Name:       Lenda KelpHANTILLE Gondek              Visit Date: 06/11/2021 09:41 am ---------------------------------------------------------------------- Performed By  Attending:        Ma RingsVictor Fang MD         Ref. Address:     [address], [place],                                                             [place] [id number]  Performed By:     Lorn Junesara Hughes,           Location[facility]                    RDMS, RDCS                               [facility] at  [facility]  Referred By:      Conard Novak MD ---------------------------------------------------------------------- Orders  #  Description                           Code        Ordered By  1  [place] MFM FETAL BPP                      16109.6     Bettey Costa     W/NONSTRESS                                       BOOKER  2  [place] MFM UA CORD DOPPLER                76820.02    CORENTHIAN                                                       BOOKER  3  [place] MFM UA DOPPLER ADDL                76820.03    CORENTHIAN     GEST RE EVAL                                      BOOKER  4  [place] MFM FETAL BPP                      04540.9     Bettey Costa     W/NONSTRESS ADD'L GEST                            BOOKER ----------------------------------------------------------------------  #  Order #                     Accession #                Episode #  1   [id number]                   [id number]                 [id number]  2  [id number]                   [id number]                 [id number]  3  [id number]                   [id number]                 [id number]  4  [id number]                   [id number]                 [id number] ---------------------------------------------------------------------- Indications  Maternal care for known or suspected poor      V76.1607  fetal growth, third trimester, fetus 1 IUGR  Twin pregnancy, di/di, third trimester         O30.043  [redacted] weeks gestation of pregnancy                Z3A.32 ---------------------------------------------------------------------- Fetal Evaluation (Fetus A)  Num Of Fetuses:         2  Fetal Heart Rate(bpm):  145  Cardiac Activity:       Observed  Fetal Lie:              Lower Fetus  Presentation:           Breech  Placenta:               Posterior  Membrane Desc:      Dividing Membrane seen  Amniotic Fluid  AFI FV:      Within normal limits                              Largest Pocket(cm)                              4.1 ---------------------------------------------------------------------- Biophysical Evaluation (Fetus A)  Amniotic F.V:   Within normal limits       F. Tone:        Observed  F. Movement:    Observed                   N.S.T:          Nonreactive  F. Breathing:   Observed                   Score:          8/10 ---------------------------------------------------------------------- Gestational Age (Fetus A)  LMP:           32w 1d        Date:  10/29/20                 EDD:   08/05/21  Best:          32w 1d     Det. By:  LMP  (10/29/20)          EDD:   08/05/21 ---------------------------------------------------------------------- Doppler - Fetal Vessels (Fetus A)  Umbilical Artery   S/D     %tile      RI    %tile                             ADFV    RDFV   2.91       64    0.66       71                                No      No ---------------------------------------------------------------------- Fetal  Evaluation (Fetus B)  Num Of Fetuses:         2  Fetal Heart Rate(bpm):  166  Cardiac Activity:       Observed  Fetal Lie:              Upper Fetus  Presentation:           Transverse  Placenta:               Anterior  Membrane Desc:      Dividing Membrane seen  Amniotic Fluid  AFI FV:      Within normal limits                              Largest Pocket(cm)                              6 ---------------------------------------------------------------------- Biophysical Evaluation (Fetus B)  Amniotic F.V:   Within normal limits       F. Tone:        Observed  F. Movement:    Observed                   N.S.T:          Nonreactive  F. Breathing:   Observed                   Score:          8/10 ---------------------------------------------------------------------- Gestational Age (Fetus B)  LMP:           32w 1d        Date:  10/29/20                 EDD:   08/05/21  Best:          32w 1d     Det. By:  LMP  (10/29/20)          EDD:   08/05/21 ---------------------------------------------------------------------- Doppler - Fetal Vessels (Fetus B)  Umbilical Artery   S/D     %tile      RI    %tile                             ADFV    RDFV    3.5       88    0.71       87                                No      No ---------------------------------------------------------------------- Comments  This patient was seen due to fetal growth restriction of twin A  in a dichorionic, diamniotic twin gestation.  She denies any  problems since her last exam.  She reports feeling vigorous  fetal movements of both fetuses throughout the day.  There was normal amniotic fluid noted on today's ultrasound  exam around both twin A and twin B.  Doppler studies of the umbilical arteries performed due to  fetal growth restriction showed a normal S/D ratio for both  twin A and twin B.  There were no signs of absent or reversed  end-diastolic flow noted today in either fetus.  A biophysical profile performed today was 8 out of 10 for both  twin  A and twin B.  Both fetuses had a nonreactive NST today.  The patient should have another NST performed in your  office later this afternoon during her prenatal visit.  She will return in 1 week for another umbilical artery Doppler  study and biophysical profile.  Delivery for fetal growth restriction in a dichorionic twin  gestation is usually recommended at around 36  weeks as  long as the fetal testing and umbilical artery Doppler studies  remain within normal  limits. ----------------------------------------------------------------------                   Ma Rings, MD Electronically Signed Final Report   06/11/2021 11:57 am ----------------------------------------------------------------------  [place] MFM UA CORD DOPPLER  Result Date: 06/04/2021 ----------------------------------------------------------------------  OBSTETRICS REPORT                       (Signed Final 06/04/2021 04:49 pm) ---------------------------------------------------------------------- Patient Info  ID #:       [id number]                          D.O.B.:  11-06-1995 (24 yrs)  Name:       Lenda Kelp              Visit Date: 06/04/2021 11:01 am ---------------------------------------------------------------------- Performed By  Attending:        Lin Landsman      Ref. Address:     [address]                    [place]                                                             [place], [place],                                                             [place] [address]  Performed By:     Francene Boyers           Location[facility]         [facility]                    RDMS                                     [facility] at                                                             [facility]  Referred By:      Conard Novak MD ---------------------------------------------------------------------- Orders  #  Description                           Code        Ordered By  1  [place] MFM OB FOLLOW UP                    [phone]    YU FANG  2  [place] MFM OB FOLLOW UP ADDL              [id number]  YU FANG     GEST  3  [place] MFM UA CORD DOPPLER                [id number]    YU FANG  4  [place] MFM FETAL BPP WO NON               76819.01    YU FANG     STRESS  5  [place] MFM FETAL BPP WO NST               16109.6     YU FANG     ADDL GESTATION ----------------------------------------------------------------------  #  Order #                     Accession #                Episode #  1  [id number]                   [id number]                 [id number]  2  [id number]                   [id number]                 [id number]  3  [id number]                   [id number]                 [id number]  4  [id number]                   [id number]                 [id number]  5  [id number]                   [id number]                 [id number] ---------------------------------------------------------------------- Indications  [redacted] weeks gestation of pregnancy                Z3A.40  Twin pregnancy, di/di, third trimester         O30.043 ---------------------------------------------------------------------- Fetal Evaluation (Fetus A)  Num Of Fetuses:         2  Fetal Heart Rate(bpm):  132  Cardiac Activity:       Observed  Presentation:           Breech  Placenta:               Anterior  P. Cord Insertion:      Previously Visualized                              Largest Pocket(cm)                              4.2 ---------------------------------------------------------------------- Biophysical Evaluation (Fetus A)  Amniotic F.V:   Within normal limits       F. Tone:        Observed  F. Movement:    Observed                   Score:          8/8  F. Breathing:   Observed ---------------------------------------------------------------------- Biometry (Fetus  A)  BPD:      80.5  mm     G. Age:  32w 2d         75  %    CI:        75.22   %    70 - 86                                                          FL/HC:      18.5   %    19.3 - 21.3  HC:      294.4  mm     G. Age:  32w 4d          51  %    HC/AC:      1.28        0.96 - 1.17  AC:      230.2  mm     G. Age:  27w 3d        < 1  %    FL/BPD:     67.7   %    71 - 87  FL:       54.5  mm     G. Age:  28w 6d        1.6  %    FL/AC:      23.7   %    20 - 24  HUM:      50.4  mm     G. Age:  29w 4d         18  %  LV:          4  mm  Est. FW:    1262  gm    2 lb 13 oz     1.1  %     FW Discordancy        21  % ---------------------------------------------------------------------- Gestational Age (Fetus A)  LMP:           31w 1d        Date:  10/29/20                 EDD:   08/05/21  U/S Today:     30w 2d                                        EDD:   08/11/21  Best:          31w 1d     Det. By:  LMP  (10/29/20)          EDD:   08/05/21 ---------------------------------------------------------------------- Anatomy (Fetus A)  Cranium:               Appears normal         Aortic Arch:            Previously seen  Cavum:                 Previously seen        Ductal Arch:            Previously seen  Ventricles:            Appears normal  Diaphragm:              Appears normal  Choroid Plexus:        Previously seen        Stomach:                Appears normal, left                                                                        sided  Cerebellum:            Previously seen        Abdomen:                Previously seen  Posterior Fossa:       Previously seen        Abdominal Wall:         Previously seen  Nuchal Fold:           Previously seen        Cord Vessels:           Previously seen  Face:                  Orbits and profile     Kidneys:                Previously seen                         previously seen  Lips:                  Previously seen        Bladder:                Appears normal  Heart:                 Appears normal         Spine:                  Previously seen                         (4CH, axis, and                         situs)  RVOT:                  Appears normal         Upper Extremities:      Previously seen   LVOT:                  Previously seen        Lower Extremities:      Previously seen ---------------------------------------------------------------------- Doppler - Fetal Vessels (Fetus A)  Umbilical Artery   S/D     %tile      RI    %tile   2.83       55    0.65       61 ---------------------------------------------------------------------- Fetal Evaluation (Fetus B)  Num Of Fetuses:         2  Fetal Heart Rate(bpm):  147  Cardiac Activity:  Observed  Presentation:           Transverse, head to maternal right  Placenta:               Anterior  P. Cord Insertion:      Previously Visualized                              Largest Pocket(cm)                              4 ---------------------------------------------------------------------- Biophysical Evaluation (Fetus B)  Amniotic F.V:   Within normal limits       F. Tone:        Observed  F. Movement:    Observed                   Score:          8/8  F. Breathing:   Observed ---------------------------------------------------------------------- Biometry (Fetus B)  BPD:      81.4  mm     G. Age:  32w 5d         84  %    CI:        73.53   %    70 - 86                                                          FL/HC:      19.4   %    19.3 - 21.3  HC:      301.6  mm     G. Age:  33w 3d         77  %    HC/AC:      1.18        0.96 - 1.17  AC:      255.8  mm     G. Age:  29w 5d         12  %    FL/BPD:     72.0   %    71 - 87  FL:       58.6  mm     G. Age:  30w 4d         22  %    FL/AC:      22.9   %    20 - 24  HUM:        54  mm     G. Age:  31w 3d         58  %  Est. FW:    1604  gm      3 lb 9 oz     22  %     FW Discordancy     0 \ 21 % ---------------------------------------------------------------------- Gestational Age (Fetus B)  LMP:           31w 1d        Date:  10/29/20                 EDD:   08/05/21  U/S Today:     31w 4d  EDD:   08/02/21  Best:          31w 1d     Det. By:  LMP  (10/29/20)          EDD:    08/05/21 ---------------------------------------------------------------------- Anatomy (Fetus B)  Cranium:               Appears normal         Aortic Arch:            Previously seen  Cavum:                 Previously seen        Ductal Arch:            Previously seen  Ventricles:            Appears normal         Diaphragm:              Appears normal  Choroid Plexus:        Previously seen        Stomach:                Appears normal, left                                                                        sided  Cerebellum:            Previously seen        Abdomen:                Previously seen  Posterior Fossa:       Previously seen        Abdominal Wall:         Previously seen  Nuchal Fold:           Previously seen        Cord Vessels:           Previously seen  Face:                  Orbits and profile     Kidneys:                Previously seen                         previously seen  Lips:                  Previously seen        Bladder:                Appears normal  Heart:                 Appears normal         Spine:                  Previously seen                         (4CH, axis, and                         situs)  RVOT:                  Appears normal         Upper Extremities:      Previously seen  LVOT:                  Appears normal         Lower Extremities:      Previously seen ---------------------------------------------------------------------- Doppler - Fetal Vessels (Fetus B)  Umbilical Artery   S/D     %tile      RI    %tile                             ADFV    RDFV   2.67       46    0.63       51                                No      No ---------------------------------------------------------------------- Impression  Follow up growth for dichorionic diamnoitic twin pregnancy  with FGR of TwIn A  Twin A normal stomach, amniotic fluid, bladder and BPP 8/8-  EFW 1.1% Breech presentation.  Twin B normal stomach, amniotic fluid, bladder and BPP 8/8-  EFW 22% head maternal right,  transverse  UA Dopplers are normal in both twin A and B, without  evidence of AEDF or REDF.  Twin discordance is 21%.  I reviewed today's exam and discuss the decreasing growth  in Twin A.  Ms. Langenderfer not good fetal movement.  Given the EFW of 1.1% I recommend continued weekly  testing with plan for delivery during the 36th week. ---------------------------------------------------------------------- Recommendations  Continue weekly testing  Repeat growth in 3 weeks. ----------------------------------------------------------------------               Lin Landsman, MD Electronically Signed Final Report   06/04/2021 04:49 pm ----------------------------------------------------------------------  [place] MFM UA CORD DOPPLER  Result Date: 05/26/2021 ----------------------------------------------------------------------  OBSTETRICS REPORT                       (Signed Final 05/26/2021 02:38 pm) ---------------------------------------------------------------------- Patient Info  ID #:       [id number]                          D.O.B.:  16-May-1996 (24 yrs)  Name:       Lenda Kelp              Visit Date: 05/26/2021 11:24 am ---------------------------------------------------------------------- Performed By  Attending:        Ma Rings MD         Ref. Address:     [address], [place],  [place] [address]  Performed By:     Lorn Junes,           [facility]                    RDMS, RDCS                               Fetal Care at                                                             [facility]  Referred By:      Conard Novak MD ---------------------------------------------------------------------- Orders  #  Description                           Code        Ordered By  1  [place] MFM UA CORD DOPPLER                76820.02    RAVI  SHANKAR  2  [place] MFM UA ADDL GEST                   76820.01    RAVI SHANKAR  3  [place] MFM OB LIMITED                     76815.01    YU FANG ----------------------------------------------------------------------  #  Order #                     Accession #                Episode #  1  [id number]                   [id number]                 [id number]  2  [id number]                   [id number]                 [id number]  3  [id number]                   [id number]                 [id number] ---------------------------------------------------------------------- Indications  Maternal care for known or suspected poor      O36.5931  fetal growth, third trimester, fetus 1 IUGR  Twin pregnancy, di/di, third trimester         O30.043  [redacted] weeks gestation of pregnancy                Z3A.29 ---------------------------------------------------------------------- Fetal Evaluation (Fetus A)  Num Of Fetuses:         2  Fetal Heart Rate(bpm):  124  Cardiac Activity:       Observed  Fetal Lie:              Lower Fetus  Presentation:  Breech  Placenta:               Posterior  Amniotic Fluid  AFI FV:      Within normal limits                              Largest Pocket(cm)                              5.1 ---------------------------------------------------------------------- Gestational Age (Fetus A)  LMP:           29w 6d        Date:  10/29/20                 EDD:   08/05/21  Best:          29w 6d     Det. By:  LMP  (10/29/20)          EDD:   08/05/21 ---------------------------------------------------------------------- Anatomy (Fetus A)  Cranium:               Appears normal         Aortic Arch:            Previously seen  Cavum:                 Previously seen        Ductal Arch:            Previously seen  Ventricles:            Previously seen        Diaphragm:              Previously seen  Choroid Plexus:        Previously seen        Stomach:                Appears normal, left                                                                         sided  Cerebellum:            Previously seen        Abdomen:                Appears normal  Posterior Fossa:       Previously seen        Abdominal Wall:         Previously seen  Nuchal Fold:           Not applicable (>20    Cord Vessels:           Appears normal ([redacted]                         wks GA)                                        vessel cord)  Face:  Orbits and profile     Kidneys:                Appear normal                         previously seen  Lips:                  Previously seen        Bladder:                Appears normal  Heart:                 Previously seen        Spine:                  Previously seen  RVOT:                  Previously seen        Upper Extremities:      Previously seen  LVOT:                  Previously seen        Lower Extremities:      Previously seen ---------------------------------------------------------------------- Doppler - Fetal Vessels (Fetus A)  Umbilical Artery   S/D     %tile      RI    %tile                             ADFV    RDFV    2.1        9    0.52      4.8                                No      No ---------------------------------------------------------------------- Fetal Evaluation (Fetus B)  Num Of Fetuses:         2  Fetal Heart Rate(bpm):  144  Cardiac Activity:       Observed  Fetal Lie:              Upper Fetus  Presentation:           Transverse, head to maternal right  Placenta:               Anterior  Amniotic Fluid  AFI FV:      Within normal limits                              Largest Pocket(cm)                              4.5 ---------------------------------------------------------------------- Gestational Age (Fetus B)  LMP:           29w 6d        Date:  10/29/20                 EDD:   08/05/21  Best:          29w 6d     Det. By:  LMP  (10/29/20)          EDD:   08/05/21 ---------------------------------------------------------------------- Anatomy (Fetus B)  Cranium:  Previously seen         Aortic Arch:            Previously seen  Cavum:                 Previously seen        Ductal Arch:            Previously seen  Ventricles:            Previously seen        Diaphragm:              Previously seen  Choroid Plexus:        Previously seen        Stomach:                Appears normal, left                                                                        sided  Cerebellum:            Previously seen        Abdomen:                Appears normal  Posterior Fossa:       Previously seen        Abdominal Wall:         Previously seen  Nuchal Fold:           Not applicable (>20    Cord Vessels:           Appears normal ([redacted]                         wks GA)                                        vessel cord)  Face:                  Orbits and profile     Kidneys:                Appear normal                         previously seen  Lips:                  Seen on prior scan     Bladder:                Appears normal  Heart:                 Appears normal         Spine:                  Previously seen                         (4CH, axis, and  situs)  RVOT:                  Previously seen        Upper Extremities:      Previously seen  LVOT:                  Previously seen        Lower Extremities:      Previously seen ---------------------------------------------------------------------- Doppler - Fetal Vessels (Fetus B)  Umbilical Artery   S/D     %tile      RI    %tile                             ADFV    RDFV   3.58       83    0.72       84                                No      No ---------------------------------------------------------------------- Comments  This patient was seen for a follow-up ultrasound exam due to  IUGR of twin A in a dichorionic twin gestation.  The fetal  abdominal circumference measurements for twin A were less  than the 10th percentile during her last exam.  She denies  any problems since her last exam and reports feeling fetal  movements of both  fetuses throughout the day.  There was normal amniotic fluid noted around both twin A  and twin B.  Doppler studies of the umbilical arteries performed for both  twin A and twin B continues to show normal forward flow.  There were no signs of absent or reversed end-diastolic flow  noted in either fetus.  She will return in 1 week for a biophysical profile, umbilical  artery Doppler studies, and growth scan.  Fetal kick count instructions were reviewed. ----------------------------------------------------------------------                   Ma Rings, MD Electronically Signed Final Report   05/26/2021 02:38 pm ----------------------------------------------------------------------  [place] MFM UA DOPPLER ADDL GEST RE EVAL  Result Date: 06/11/2021 ----------------------------------------------------------------------  OBSTETRICS REPORT                       (Signed Final 06/11/2021 11:57 am) ---------------------------------------------------------------------- Patient Info  ID #:       [id number]                          D.O.B.:  [birth year] (24 yrs)  Name:       Lenda Kelp              Visit Date: 06/11/2021 09:41 am ---------------------------------------------------------------------- Performed By  Attending:        Ma Rings MD         Ref. Address:     [address], [place],  [place] [address]  Performed By:     Lorn Junes,           Location[facility]         [facility]                    RDMS, RDCS                               Fetal Care at                                                             [facility]  Referred By:      Conard Novak MD ---------------------------------------------------------------------- Orders  #  Description                           Code        Ordered By  1  [place] MFM FETAL BPP                      61950.9     Michaelene Song  2  [place] MFM UA CORD DOPPLER                76820.02    Lin Landsman  3  [place] MFM UA DOPPLER ADDL                76820.03    CORENTHIAN     GEST RE EVAL                                      BOOKER  4  [place] MFM FETAL BPP                      32671.2     Bettey Costa     W/NONSTRESS ADD'L Ammie Dalton ----------------------------------------------------------------------  #  Order #                     Accession #                Episode #  1  [id number]                   [id number]  [id number]  2  [id number]                   [id number]                 [id number]  3  [id number]                   [id number]                 [id number]  4  [id number]                   [id number]                 [id number] ---------------------------------------------------------------------- Indications  Maternal care for known or suspected poor      O36.5931  fetal growth, third trimester, fetus 1 IUGR  Twin pregnancy, di/di, third trimester         O30.043  [redacted] weeks gestation of pregnancy                Z3A.32 ---------------------------------------------------------------------- Fetal Evaluation (Fetus A)  Num Of Fetuses:         2  Fetal Heart Rate(bpm):  145  Cardiac Activity:       Observed  Fetal Lie:              Lower Fetus  Presentation:           Breech  Placenta:               Posterior  Membrane Desc:      Dividing Membrane seen  Amniotic Fluid  AFI FV:      Within normal limits                              Largest Pocket(cm)                              4.1 ---------------------------------------------------------------------- Biophysical Evaluation (Fetus A)  Amniotic F.V:   Within normal limits       F. Tone:        Observed  F. Movement:    Observed                   N.S.T:          Nonreactive  F. Breathing:   Observed                   Score:          8/10  ---------------------------------------------------------------------- Gestational Age (Fetus A)  LMP:           32w 1d        Date:  10/29/20                 EDD:   08/05/21  Best:          32w 1d     Det. By:  LMP  (10/29/20)          EDD:   08/05/21 ---------------------------------------------------------------------- Doppler - Fetal Vessels (Fetus A)  Umbilical Artery   S/D     %tile      RI    %tile                             ADFV    RDFV   2.91  64    0.66       71                                No      No ---------------------------------------------------------------------- Fetal Evaluation (Fetus B)  Num Of Fetuses:         2  Fetal Heart Rate(bpm):  166  Cardiac Activity:       Observed  Fetal Lie:              Upper Fetus  Presentation:           Transverse  Placenta:               Anterior  Membrane Desc:      Dividing Membrane seen  Amniotic Fluid  AFI FV:      Within normal limits                              Largest Pocket(cm)                              6 ---------------------------------------------------------------------- Biophysical Evaluation (Fetus B)  Amniotic F.V:   Within normal limits       F. Tone:        Observed  F. Movement:    Observed                   N.S.T:          Nonreactive  F. Breathing:   Observed                   Score:          8/10 ---------------------------------------------------------------------- Gestational Age (Fetus B)  LMP:           32w 1d        Date:  10/29/20                 EDD:   08/05/21  Best:          32w 1d     Det. By:  LMP  (10/29/20)          EDD:   08/05/21 ---------------------------------------------------------------------- Doppler - Fetal Vessels (Fetus B)  Umbilical Artery   S/D     %tile      RI    %tile                             ADFV    RDFV    3.5       88    0.71       87                                No      No ---------------------------------------------------------------------- Comments  This patient was seen due to fetal  growth restriction of twin A  in a dichorionic, diamniotic twin gestation.  She denies any  problems since her last exam.  She reports feeling vigorous  fetal movements of both fetuses throughout the day.  There was normal amniotic fluid noted on today's ultrasound  exam around both twin A and twin B.  Doppler studies of the umbilical arteries performed due to  fetal growth restriction showed a normal S/D ratio for both  twin A and twin B.  There were no signs of absent or reversed  end-diastolic flow noted today in either fetus.  A biophysical profile performed today was 8 out of 10 for both  twin A and twin B.  Both fetuses had a nonreactive NST today.  The patient should have another NST performed in your  office later this afternoon during her prenatal visit.  She will return in 1 week for another umbilical artery Doppler  study and biophysical profile.  Delivery for fetal growth restriction in a dichorionic twin  gestation is usually recommended at around 36  weeks as  long as the fetal testing and umbilical artery Doppler studies  remain within normal limits. ----------------------------------------------------------------------                   Ma Rings, MD Electronically Signed Final Report   06/11/2021 11:57 am ----------------------------------------------------------------------  [place] MFM FETAL BPP WO NST ADDL GESTATION  Result Date: 06/04/2021 ----------------------------------------------------------------------  OBSTETRICS REPORT                       (Signed Final 06/04/2021 04:49 pm) ---------------------------------------------------------------------- Patient Info  ID #:       [id number]                          D.O.B.:  1996-09-25 (24 yrs)  Name:       Lenda Kelp              Visit Date: 06/04/2021 11:01 am ---------------------------------------------------------------------- Performed By  Attending:        Lin Landsman      Ref. Address:     [address]                    [place]                                                              [place], [place],                                                             [place] [address]  Performed By:     Francene Boyers           Location[facility]                    RDMS                                     Fetal Care at  [facility]  Referred By:      Conard Novak MD ---------------------------------------------------------------------- Orders  #  Description                           Code        Ordered By  1  [place] MFM OB FOLLOW UP                   [phone]    YU FANG  2  [place] MFM OB FOLLOW UP ADDL              76816.02    YU FANG     GEST  3  [place] MFM UA CORD DOPPLER                76820.02    YU FANG  4  [place] MFM FETAL BPP WO NON               76819.01    YU FANG     STRESS  5  [place] MFM FETAL BPP WO NST               45409.8     YU FANG     ADDL GESTATION ----------------------------------------------------------------------  #  Order #                     Accession #                Episode #  1  [id number]                   [id number]                 [id number]  2  [id number]                   [id number]                 [id number]  3  [id number]                   [id number]                 [id number]  4  [id number]                   [id number]                 [id number]  5  [id number]                   [id number]                 [id number] ---------------------------------------------------------------------- Indications  [redacted] weeks gestation of pregnancy                Z3A.68  Twin pregnancy, di/di, third trimester         O30.043 ---------------------------------------------------------------------- Fetal Evaluation (Fetus A)  Num Of Fetuses:         2  Fetal Heart Rate(bpm):  132  Cardiac Activity:       Observed  Presentation:           Breech  Placenta:               Anterior  P. Cord Insertion:      Previously Visualized  Largest  Pocket(cm)                              4.2 ---------------------------------------------------------------------- Biophysical Evaluation (Fetus A)  Amniotic F.V:   Within normal limits       F. Tone:        Observed  F. Movement:    Observed                   Score:          8/8  F. Breathing:   Observed ---------------------------------------------------------------------- Biometry (Fetus A)  BPD:      80.5  mm     G. Age:  32w 2d         75  %    CI:        75.22   %    70 - 86                                                          FL/HC:      18.5   %    19.3 - 21.3  HC:      294.4  mm     G. Age:  32w 4d         51  %    HC/AC:      1.28        0.96 - 1.17  AC:      230.2  mm     G. Age:  27w 3d        < 1  %    FL/BPD:     67.7   %    71 - 87  FL:       54.5  mm     G. Age:  28w 6d        1.6  %    FL/AC:      23.7   %    20 - 24  HUM:      50.4  mm     G. Age:  29w 4d         18  %  LV:          4  mm  Est. FW:    1262  gm    2 lb 13 oz     1.1  %     FW Discordancy        21  % ---------------------------------------------------------------------- Gestational Age (Fetus A)  LMP:           31w 1d        Date:  10/29/20                 EDD:   08/05/21  U/S Today:     30w 2d                                        EDD:   08/11/21  Best:          31w 1d     Det. By:  LMP  (10/29/20)          EDD:   08/05/21 ----------------------------------------------------------------------  Anatomy (Fetus A)  Cranium:               Appears normal         Aortic Arch:            Previously seen  Cavum:                 Previously seen        Ductal Arch:            Previously seen  Ventricles:            Appears normal         Diaphragm:              Appears normal  Choroid Plexus:        Previously seen        Stomach:                Appears normal, left                                                                        sided  Cerebellum:            Previously seen        Abdomen:                Previously seen  Posterior  Fossa:       Previously seen        Abdominal Wall:         Previously seen  Nuchal Fold:           Previously seen        Cord Vessels:           Previously seen  Face:                  Orbits and profile     Kidneys:                Previously seen                         previously seen  Lips:                  Previously seen        Bladder:                Appears normal  Heart:                 Appears normal         Spine:                  Previously seen                         (4CH, axis, and                         situs)  RVOT:                  Appears normal         Upper Extremities:      Previously seen  LVOT:  Previously seen        Lower Extremities:      Previously seen ---------------------------------------------------------------------- Doppler - Fetal Vessels (Fetus A)  Umbilical Artery   S/D     %tile      RI    %tile   2.83       55    0.65       61 ---------------------------------------------------------------------- Fetal Evaluation (Fetus B)  Num Of Fetuses:         2  Fetal Heart Rate(bpm):  147  Cardiac Activity:       Observed  Presentation:           Transverse, head to maternal right  Placenta:               Anterior  P. Cord Insertion:      Previously Visualized                              Largest Pocket(cm)                              4 ---------------------------------------------------------------------- Biophysical Evaluation (Fetus B)  Amniotic F.V:   Within normal limits       F. Tone:        Observed  F. Movement:    Observed                   Score:          8/8  F. Breathing:   Observed ---------------------------------------------------------------------- Biometry (Fetus B)  BPD:      81.4  mm     G. Age:  32w 5d         84  %    CI:        73.53   %    70 - 86                                                          FL/HC:      19.4   %    19.3 - 21.3  HC:      301.6  mm     G. Age:  33w 3d         77  %    HC/AC:      1.18        0.96 - 1.17  AC:      255.8   mm     G. Age:  29w 5d         12  %    FL/BPD:     72.0   %    71 - 87  FL:       58.6  mm     G. Age:  30w 4d         22  %    FL/AC:      22.9   %    20 - 24  HUM:        54  mm     G. Age:  31w 3d         58  %  Est. FW:    1604  gm  3 lb 9 oz     22  %     FW Discordancy     0 \ 21 % ---------------------------------------------------------------------- Gestational Age (Fetus B)  LMP:           31w 1d        Date:  10/29/20                 EDD:   08/05/21  U/S Today:     31w 4d                                        EDD:   08/02/21  Best:          31w 1d     Det. By:  LMP  (10/29/20)          EDD:   08/05/21 ---------------------------------------------------------------------- Anatomy (Fetus B)  Cranium:               Appears normal         Aortic Arch:            Previously seen  Cavum:                 Previously seen        Ductal Arch:            Previously seen  Ventricles:            Appears normal         Diaphragm:              Appears normal  Choroid Plexus:        Previously seen        Stomach:                Appears normal, left                                                                        sided  Cerebellum:            Previously seen        Abdomen:                Previously seen  Posterior Fossa:       Previously seen        Abdominal Wall:         Previously seen  Nuchal Fold:           Previously seen        Cord Vessels:           Previously seen  Face:                  Orbits and profile     Kidneys:                Previously seen                         previously seen  Lips:                  Previously seen  Bladder:                Appears normal  Heart:                 Appears normal         Spine:                  Previously seen                         (4CH, axis, and                         situs)  RVOT:                  Appears normal         Upper Extremities:      Previously seen  LVOT:                  Appears normal         Lower Extremities:      Previously seen  ---------------------------------------------------------------------- Doppler - Fetal Vessels (Fetus B)  Umbilical Artery   S/D     %tile      RI    %tile                             ADFV    RDFV   2.67       46    0.63       51                                No      No ---------------------------------------------------------------------- Impression  Follow up growth for dichorionic diamnoitic twin pregnancy  with FGR of TwIn A  Twin A normal stomach, amniotic fluid, bladder and BPP 8/8-  EFW 1.1% Breech presentation.  Twin B normal stomach, amniotic fluid, bladder and BPP 8/8-  EFW 22% head maternal right, transverse  UA Dopplers are normal in both twin A and B, without  evidence of AEDF or REDF.  Twin discordance is 21%.  I reviewed today's exam and discuss the decreasing growth  in Twin A.  Ms. Livingston not good fetal movement.  Given the EFW of 1.1% I recommend continued weekly  testing with plan for delivery during the 36th week. ---------------------------------------------------------------------- Recommendations  Continue weekly testing  Repeat growth in 3 weeks. ----------------------------------------------------------------------               Lin Landsman, MD Electronically Signed Final Report   06/04/2021 04:49 pm ----------------------------------------------------------------------  US Abdomen Limited RUQ (LIVER/GB)  Result Date: 05/20/2021 CLINICAL DATA:  Right upper quadrant pain, pregnant EXAM: ULTRASOUND ABDOMEN LIMITED RIGHT UPPER QUADRANT COMPARISON:  None. FINDINGS: Gallbladder: No gallstones or wall thickening visualized. No sonographic Murphy sign noted by sonographer. Common bile duct: Diameter: 2 mm, normal Liver: No focal lesion identified. Within normal limits in parenchymal echogenicity. Portal vein is patent on color Doppler imaging with normal direction of blood flow towards the liver. Other: Right hydronephrosis. IMPRESSION: Unremarkable sonographic appearance of the liver  and gallbladder. Right hydronephrosis probably related to pregnancy. Electronically Signed   By: Guadlupe Spanish M.D.   On: 05/20/2021 11:42    ASSESSMENT: Anemia in third trimester pregnancy.  PLAN:    Anemia in third trimester pregnancy:  Patient's hemoglobin has declined to 9.0 and her iron stores are reduced at the lower limit of normal.  She was also found to have decreased B12 levels.  Patient will return to clinic in 1 and 2 weeks to receive 510 mg IV Feraheme.  Will also give 1000 mcg IM B12 injection.  Patient reports that because she is having twins she will be induced early likely in October.  No further intervention is needed.  Patient will follow-up in January 2023 for repeat laboratory work, further evaluation, and consideration of additional IV iron if needed. B12 deficiency: IM B12 as above. Pregnancy: Patient reports that she is due with twins in November, but she likely will be induced early sometime in October.  Continue follow-up with OB as scheduled.  I spent a total of 45 minutes reviewing chart data, face-to-face evaluation with the patient, counseling and coordination of care as detailed above.   Patient expressed understanding and was in agreement with this plan. She also understands that She can call clinic at any time with any questions, concerns, or complaints.    Jeralyn Ruths, MD   06/12/2021 6:42 AM

## 2021-06-14 ENCOUNTER — Encounter: Payer: Self-pay | Admitting: Advanced Practice Midwife

## 2021-06-14 ENCOUNTER — Observation Stay
Admission: EM | Admit: 2021-06-14 | Discharge: 2021-06-14 | Disposition: A | Discharge status: Home / Self Care | Payer: Medicaid Other | Attending: Advanced Practice Midwife | Admitting: Advanced Practice Midwife

## 2021-06-14 ENCOUNTER — Observation Stay: Payer: Medicaid Other

## 2021-06-14 ENCOUNTER — Other Ambulatory Visit: Payer: Self-pay

## 2021-06-14 DIAGNOSIS — Z91040 Latex allergy status: Secondary | ICD-10-CM | POA: Insufficient documentation

## 2021-06-14 DIAGNOSIS — O30003 Twin pregnancy, unspecified number of placenta and unspecified number of amniotic sacs, third trimester: Secondary | ICD-10-CM | POA: Diagnosis present

## 2021-06-14 DIAGNOSIS — Z7982 Long term (current) use of aspirin: Secondary | ICD-10-CM | POA: Insufficient documentation

## 2021-06-14 DIAGNOSIS — O368131 Decreased fetal movements, third trimester, fetus 1: Secondary | ICD-10-CM

## 2021-06-14 DIAGNOSIS — Z3A32 32 weeks gestation of pregnancy: Secondary | ICD-10-CM

## 2021-06-14 DIAGNOSIS — Z87891 Personal history of nicotine dependence: Secondary | ICD-10-CM | POA: Diagnosis not present

## 2021-06-14 DIAGNOSIS — O30043 Twin pregnancy, dichorionic/diamniotic, third trimester: Secondary | ICD-10-CM

## 2021-06-14 DIAGNOSIS — O368190 Decreased fetal movements, unspecified trimester, not applicable or unspecified: Secondary | ICD-10-CM | POA: Diagnosis present

## 2021-06-14 DIAGNOSIS — O365990 Maternal care for other known or suspected poor fetal growth, unspecified trimester, not applicable or unspecified: Secondary | ICD-10-CM | POA: Diagnosis present

## 2021-06-14 DIAGNOSIS — O0993 Supervision of high risk pregnancy, unspecified, third trimester: Secondary | ICD-10-CM | POA: Insufficient documentation

## 2021-06-14 DIAGNOSIS — O0990 Supervision of high risk pregnancy, unspecified, unspecified trimester: Secondary | ICD-10-CM

## 2021-06-14 DIAGNOSIS — Z8659 Personal history of other mental and behavioral disorders: Secondary | ICD-10-CM

## 2021-06-14 DIAGNOSIS — O365931 Maternal care for other known or suspected poor fetal growth, third trimester, fetus 1: Secondary | ICD-10-CM

## 2021-06-14 NOTE — Discharge Instructions (Signed)
Please keep your next scheduled appointment.  If you have questions or concerns please call your on call provider.

## 2021-06-14 NOTE — Discharge Summary (Signed)
Physician Final Progress Note  Patient ID: Kendra Ward MRN: [id number] DOB/AGE: 01/21/1996 25 y.o.  Admit date: 06/14/2021 Admitting provider: Tresea Mall, CNM Discharge date: 06/14/2021   Admission Diagnoses:  1) intrauterine pregnancy at [redacted]w[redacted]d  2) decreased fetal movement twin A  Discharge Diagnoses:  Principal Problem:   Supervision of high risk pregnancy, antepartum Active Problems:   Twin gestation in third trimester   Intrauterine growth restriction (IUGR) affecting care of mother   Decreased fetal movement   [redacted] weeks gestation of pregnancy    History of Present Illness: The patient is a 25 y.o. female G2P0010 at [redacted]w[redacted]d [name] IUP who presents for decreased fetal movement twin A since yesterday. She reports not feeling twin A at all since then. She reports good movement of twin B. She denies vaginal bleeding or leakage of fluid. She denies contractions.   She is admitted to observation for monitoring. Ultrasound also done. All are reassuring. Subjectively decreased AFI twin A (1.9 cm). Her pregnancy is complicated by IUGR twin A. She has collaborative care with MFM. Patient is discharged to home with instructions and precautions.  Past Medical History:  Diagnosis Date   Headache    No known health problems    Ovarian cyst 2022   L side-ruptured    Past Surgical History:  Procedure Laterality Date   DIAGNOSTIC LAPAROSCOPY WITH REMOVAL OF ECTOPIC PREGNANCY N/A 11/26/2020   Procedure: DIAGNOSTIC LAPAROSCOPY;  Surgeon: Nadara Mustard, MD;  Location: [facility] ORS;  Service: Gynecology;  Laterality: N/A;    No current facility-administered medications on file prior to encounter.   Current Outpatient Medications on File Prior to Encounter  Medication Sig Dispense Refill   aspirin 81 MG chewable tablet Chew 81 mg by mouth daily.     Butalbital-APAP-Caffeine 50-325-40 MG capsule Take 1-2 capsules by mouth every 6 (six) hours as needed for headache. 30 capsule 1   ferrous  sulfate 325 (65 FE) MG tablet Take 325 mg by mouth daily with breakfast.     Prenatal Vit-Fe Fumarate-FA (MULTIVITAMIN-PRENATAL) 27-0.8 MG TABS tablet Take 1 tablet by mouth daily at 12 noon.      Allergies  Allergen Reactions   Latex Itching    Social History   Socioeconomic History   Marital status: Married    Spouse name: Tonye Becket   Number of children: Not on file   Years of education: Not on file   Highest education level: Not on file  Occupational History   Not on file  Tobacco Use   Smoking status: Former    Types: Cigarettes    Quit date: 11/29/2020    Years since quitting: 0.5   Smokeless tobacco: Never  Vaping Use   Vaping Use: Never used  Substance and Sexual Activity   Alcohol use: Not Currently   Drug use: Yes    Types: Marijuana    Comment: 11/29/2020   Sexual activity: Yes    Partners: Male  Other Topics Concern   Not on file  Social History Narrative   Not on file   Social Determinants of Health   Financial Resource Strain: Not on file  Food Insecurity: Not on file  Transportation Needs: Not on file  Physical Activity: Not on file  Stress: Not on file  Social Connections: Not on file  Intimate Partner Violence: Not on file    Family History  Problem Relation Age of Onset   Diabetes Maternal Uncle    Diabetes Maternal Grandmother    Diabetes Maternal  Grandfather      Review of Systems  Constitutional:  Negative for chills and fever.  HENT:  Negative for congestion, ear discharge, ear pain, hearing loss, sinus pain and sore throat.   Eyes:  Negative for blurred vision and double vision.  Respiratory:  Negative for cough, shortness of breath and wheezing.   Cardiovascular:  Negative for chest pain, palpitations and leg swelling.  Gastrointestinal:  Negative for abdominal pain, blood in stool, constipation, diarrhea, heartburn, melena, nausea and vomiting.  Genitourinary:  Negative for dysuria, flank pain, frequency, hematuria and urgency.   Musculoskeletal:  Negative for back pain, joint pain and myalgias.  Skin:  Negative for itching and rash.  Neurological:  Negative for dizziness, tingling, tremors, sensory change, speech change, focal weakness, seizures, loss of consciousness, weakness and headaches.  Endo/Heme/Allergies:  Negative for environmental allergies. Does not bruise/bleed easily.  Psychiatric/Behavioral:  Negative for depression, hallucinations, memory loss, substance abuse and suicidal ideas. The patient is not nervous/anxious and does not have insomnia.     Physical Exam: BP 122/72 (BP Location: Left Arm)   Pulse 78   Temp 98 F (36.7 C) (Oral)   Resp 14   Ht 5\' 6"  (1.676 m)   Wt 86.6 kg Comment: 191lbs  LMP 10/29/2020   BMI 30.83 kg/m   Constitutional: Well nourished, well developed female in no acute distress.  HEENT: normal Skin: Warm and dry.  Cardiovascular: Regular rate and rhythm.   Extremity:  no edema   Respiratory: Clear to auscultation bilateral. Normal respiratory effort Abdomen: FHT present Back: no CVAT Neuro: DTRs 2+, Cranial nerves grossly intact Psych: Alert and Oriented x3. No memory deficits. Normal mood and affect.   Toco: negative Fetal well being:  Twin A 155 bpm, moderate variability, +accelerations, -decelerations Twin B 145 bpm, moderate variability, +accelerations, -decelerations Movement of both twins seen on   Consults: None  Significant Findings/ Diagnostic Studies: imaging Narrative & Impression  CLINICAL DATA:  Decreased fetal movement, twin gestation   EXAM: LIMITED OBSTETRIC ULTRASOUND   FINDINGS: Number of Fetuses:  2   Separating Membrane: Visualized   TWIN 1/A   Heart Rate:  155 bpm   Movement: Yes   Presentation: Breech   Placental Location: Posterior   Previa: No   Amniotic Fluid (Subjective): Subjectively decreased, maximum vertical pocket = 1.9 cm   BPD:  8.09 cm 32w 3d   TWIN 2/B   Heart Rate:  150 bpm   Movement: Yes    Presentation: Cephalic   Placental Location: Anterior   Previa: No   Amniotic Fluid (Subjective): Within normal limits, maximum vertical pocket = 2.5 cm   BPD:  8.50cm 34w 2d   MATERNAL FINDINGS:   Cervix:  Appears closed.   Uterus/Adnexae: No abnormality visualized.   IMPRESSION: Live twin intrauterine gestations as above.   Subjectively decreased amniotic fluid volume with twin A, normal with twin B.   No sonographic abnormalities identified on limited assessment.   This exam is performed on an emergent basis and does not comprehensively evaluate fetal size, dating, or anatomy; follow-up complete OB 10/31/2020 should be considered if further fetal assessment is warranted     Electronically Signed   By: [place] M.D.   On: 06/14/2021 15:08    Procedures: NST  Hospital Course: The patient was admitted to Labor and Delivery Triage for observation.   Discharge Condition: good  Disposition: Discharge disposition: 01-Home or Self Care  Diet: Regular diet  Discharge Activity: Activity as tolerated  Discharge Instructions     Discharge activity:  No Restrictions   Complete by: As directed    Discharge diet:  No restrictions   Complete by: As directed    Notify physician for a general feeling that "something is not right"   Complete by: As directed    Notify physician for increase or change in vaginal discharge   Complete by: As directed    Notify physician for intestinal cramps, with or without diarrhea, sometimes described as "gas pain"   Complete by: As directed    Notify physician for leaking of fluid   Complete by: As directed    Notify physician for low, dull backache, unrelieved by heat or Tylenol   Complete by: As directed    Notify physician for menstrual like cramps   Complete by: As directed    Notify physician for pelvic pressure   Complete by: As directed    Notify physician for uterine contractions.  These may be painless and feel like the  uterus is tightening or the baby is  "balling up"   Complete by: As directed    Notify physician for vaginal bleeding   Complete by: As directed    PRETERM LABOR:  Includes any of the follwing symptoms that occur between 20 - [redacted] weeks gestation.  If these symptoms are not stopped, preterm labor can result in preterm delivery, placing your baby at risk   Complete by: As directed       Allergies as of 06/14/2021       Reactions   Latex Itching        Medication List     TAKE these medications    aspirin 81 MG chewable tablet Chew 81 mg by mouth daily.   Butalbital-APAP-Caffeine 50-325-40 MG capsule Take 1-2 capsules by mouth every 6 (six) hours as needed for headache.   ferrous sulfate 325 (65 FE) MG tablet Take 325 mg by mouth daily with breakfast.   multivitamin-prenatal 27-0.8 MG Tabs tablet Take 1 tablet by mouth daily at 12 noon.        Follow-up Information     [facility]. Go to.   Specialty: Obstetrics and Gynecology Why: scheduled appointment Contact information: [address] [place] [address] [phone]                Total time spent taking care of this patient: 29 minutes  Signed: Tresea Mall, CNM  06/14/2021, 4:12 PM

## 2021-06-17 ENCOUNTER — Ambulatory Visit (INDEPENDENT_AMBULATORY_CARE_PROVIDER_SITE_OTHER): Payer: Medicaid Other | Admitting: Obstetrics and Gynecology

## 2021-06-17 ENCOUNTER — Other Ambulatory Visit: Payer: Self-pay | Admitting: Obstetrics

## 2021-06-17 ENCOUNTER — Other Ambulatory Visit (HOSPITAL_COMMUNITY)
Admission: RE | Admit: 2021-06-17 | Discharge: 2021-06-17 | Disposition: A | Discharge status: Home / Self Care | Payer: Medicaid Other | Source: Ambulatory Visit | Attending: Obstetrics and Gynecology | Admitting: Obstetrics and Gynecology

## 2021-06-17 ENCOUNTER — Encounter: Payer: Self-pay | Admitting: Obstetrics and Gynecology

## 2021-06-17 ENCOUNTER — Other Ambulatory Visit: Payer: Self-pay

## 2021-06-17 VITALS — BP 118/78 | Wt 196.0 lb

## 2021-06-17 DIAGNOSIS — O365931 Maternal care for other known or suspected poor fetal growth, third trimester, fetus 1: Secondary | ICD-10-CM

## 2021-06-17 DIAGNOSIS — O99013 Anemia complicating pregnancy, third trimester: Secondary | ICD-10-CM

## 2021-06-17 DIAGNOSIS — O30043 Twin pregnancy, dichorionic/diamniotic, third trimester: Secondary | ICD-10-CM

## 2021-06-17 DIAGNOSIS — O0993 Supervision of high risk pregnancy, unspecified, third trimester: Secondary | ICD-10-CM

## 2021-06-17 NOTE — Progress Notes (Signed)
Routine Prenatal Care Visit  Subjective  Kendra Ward is a 25 y.o. G2P0010 at [redacted]w[redacted]d being seen today for ongoing prenatal care.  She is currently monitored for the following issues for this high-risk pregnancy and has LLQ pain; Left ovarian cyst; Twin gestation in third trimester; Supervision of high risk pregnancy, antepartum; Intrauterine growth restriction (IUGR) affecting care of mother; Anemia in pregnancy, third trimester; Abdominal pain in pregnancy, third trimester; History of depression; and Decreased fetal movement on their problem list.  ----------------------------------------------------------------------------------- Patient reports no complaints.   Contractions: Not present. Vag. Bleeding: None.  Movement: Present. Denies leaking of fluid.  ----------------------------------------------------------------------------------- The following portions of the patient's history were reviewed and updated as appropriate: allergies, current medications, past family history, past medical history, past social history, past surgical history and problem list. Problem list updated.   Objective  Blood pressure 118/78, weight 196 lb (88.9 kg), last menstrual period 10/29/2020. Pregravid weight 150 lb (68 kg) Total Weight Gain 46 lb (20.9 kg) Urinalysis:      Fetal Status:     Movement: Present     General:  Alert, oriented and cooperative. Patient is in no acute distress.  Skin: Skin is warm and dry. No rash noted.   Cardiovascular: Normal heart rate noted  Respiratory: Normal respiratory effort, no problems with respiration noted  Abdomen: Soft, gravid, appropriate for gestational age. Pain/Pressure: Absent     Pelvic:  Cervical exam deferred        Extremities: Normal range of motion.     Mental Status: Normal mood and affect. Normal behavior. Normal judgment and thought content.     Assessment   25 y.o. G2P0010 at [redacted]w[redacted]d by  08/05/2021, by Last Menstrual Period presenting  for routine prenatal visit  Plan   March 2022 Problems (from 12/17/20 to present)     Problem Noted Resolved   History of depression 06/11/2021 by Tresea Mall, CNM No   Intrauterine growth restriction (IUGR) affecting care of mother 05/15/2021 by Conard Novak, MD No   Supervision of high risk pregnancy, antepartum 12/24/2020 by Conard Novak, MD No   Overview Addendum 06/17/2021 12:56 PM by Natale Milch, MD      Nursing Staff Provider  Office Location  [facility] Dating   LMP = 8 wk [place]  Language  English Anatomy US  complete  Flu Vaccine   Genetic Screen  NIPS: normal XY/XX  TDaP vaccine   06/11/2021 Hgb A1C or  GTT  Third trimester :   Covid vaccinated   LAB RESULTS   Rhogam  Not needed Blood Type O/Positive/-- (04/14 1137)   Feeding Plan Breast Antibody Negative (04/14 1137)  Contraception  Rubella 5.03 (04/14 1137)  Circumcision  RPR nonreactive  Pediatrician   HBsAg Negative (04/14 1137)   Support Person  FOB HIV negative  Prenatal Classes  Varicella Immune    GBS    BTL Consent     VBAC Consent  Pap 2022 NIL    Hgb Electro      CF      SMA         Dichorionic Di amniotic pregnancy Baby A - IUGR Maternal Anemia in pregnancy      Twin gestation in third trimester 12/08/2020 by Nadara Mustard, MD No   Overview Addendum 06/17/2021 11:47 AM by Natale Milch, MD    Dichorionic Diamniotic Twin Pregnancy Plan  Mom should take 60-120mg  of elemental iron a day as well as 1mg  of  folic acid  18-20 wks:  Anatomy Ultrasound 20-Delivery: Growth Ultrasounds every 2-4 weeks** 36 wks: Initiate NSTs (initiate earlier if indicated)  Delivery No complications: 38 0/7 - 38 6/7 Isolated IUGR: 36 0/7- 37 6/7 IUGR with abnormal dopplers, hypertension, etc:  32 0/7 - 34 6/7      [redacted] weeks gestation of pregnancy 06/14/2021 by Tresea Mall, CNM 06/17/2021 by Natale Milch, MD        Plan NSTs on Tuesday at 10 am with L&D-I called labor and delivery and  scheduled these. Cesarean Section 07/09/2021 with Dr. Jean Rosenthal  One hour was collected on 05/15/2021 and for that reason was canceled as part of [facility] pick up errors.  For this reason she will return tomorrow for a 1 hour glucose testing at 10 am. GBS and GC/CT today  Hemoglobin electrophoresis ordered for next visit as well.  Gestational age appropriate obstetric precautions including but not limited to vaginal bleeding, contractions, leaking of fluid and fetal movement were reviewed in detail with the patient.    Return for ROB weekly for 3 weeks with MD on Monday or Tuesday.  Natale Milch MD [facility], [facility] : [facility] 06/17/2021, 11:49 AM

## 2021-06-17 NOTE — Progress Notes (Signed)
No vb. No lof. Pt went to ER for DFM.

## 2021-06-17 NOTE — Patient Instructions (Signed)
Plan NSTs  on Tuesday with L&D at 10 AM. Cesarean Section 07/09/2021 with Dr. Jean Rosenthal.  Cesarean Delivery Cesarean birth, or cesarean delivery, is the surgical delivery of a baby through an incision in the abdomen and the uterus. This may be referred to as a C-section. This procedure may be scheduled ahead of time, or it may be done in an emergency situation. Tell a health care provider about: Any allergies you have. All medicines you are taking, including vitamins, herbs, eye drops, creams, and over-the-counter medicines. Any problems you or family members have had with anesthetic medicines. Any blood disorders you have. Any surgeries you have had. Any medical conditions you have. Whether you or any members of your family have a history of deep vein thrombosis (DVT) or pulmonary embolism (PE). What are the risks? Generally, this is a safe procedure. However, problems may occur, including: Infection. Bleeding. Allergic reactions to medicines. Damage to other structures or organs. Blood clots. Injury to your baby. What happens before the procedure? General instructions Follow instructions from your health care provider about eating or drinking restrictions. If you know that you are going to have a cesarean delivery, do not shave your pubic area. Shaving before the procedure may increase your risk of infection. Plan to have someone take you home from the hospital. Ask your health care provider what steps will be taken to prevent infection. These may include: Removing hair at the surgery site. Washing skin with a germ-killing soap. Taking antibiotic medicine. Depending on the reason for your cesarean delivery, you may have a physical exam or additional testing, such as an ultrasound. You may have your blood or urine tested. Questions for your health care provider Ask your health care provider about: Changing or stopping your regular medicines. This is especially important if you are  taking diabetes medicines or blood thinners. Your pain management plan. This is especially important if you plan to breastfeed your baby. How long you will be in the hospital after the procedure. Any concerns you may have about receiving blood products, if you need them during the procedure. Cord blood banking, if you plan to collect your baby's umbilical cord blood. You may also want to ask your health care provider: Whether you will be able to hold or breastfeed your baby while you are still in the operating room. Whether your baby can stay with you immediately after the procedure and during your recovery. Whether a family member or a person of your choice can go with you into the operating room and stay with you during the procedure, immediately after the procedure, and during your recovery. What happens during the procedure?  An IV will be inserted into one of your veins. Fluid and medicines, such as antibiotics, will be given before the surgery. Fetal monitors will be placed on your abdomen to check your baby's heart rate. You may be given a special warming gown to wear to keep your temperature stable. A catheter may be inserted into your bladder through your urethra. This drains your urine during the procedure. You may be given one or more of the following: A medicine to numb the area (local anesthetic). A medicine to make you fall asleep (general anesthetic). A medicine (regional anesthetic) that is injected into your back or through a small thin tube placed in your back (spinal anesthetic or epidural anesthetic). This numbs everything below the injection site and allows you to stay awake during your procedure. If this makes you feel nauseous, tell your  health care provider. Medicines will be available to help reduce any nausea you may feel. An incision will be made in your abdomen, and then in your uterus. If you are awake during your procedure, you may feel tugging and pulling in your  abdomen, but you should not feel pain. If you feel pain, tell your health care provider immediately. Your baby will be removed from your uterus. You may feel more pressure or pushing while this happens. Immediately after birth, your baby will be dried and kept warm. You may be able to hold and breastfeed your baby. The umbilical cord may be clamped and cut during this time. This usually occurs after waiting a period of 1-2 minutes after delivery. Your placenta will be removed from your uterus. Your incisions will be closed with stitches (sutures). Staples, skin glue, or adhesive strips may also be applied to the incision in your abdomen. Bandages (dressings) may be placed over the incision in your abdomen. The procedure may vary among health care providers and hospitals. What happens after the procedure? Your blood pressure, heart rate, breathing rate, and blood oxygen level will be monitored until you are discharged from the hospital. You may continue to receive fluids and medicines through an IV. You will have some pain. Medicines will be available to help control your pain. To help prevent blood clots: You may be given medicines. You may have to wear compression stockings or devices. You will be encouraged to walk around when you are able. Hospital staff will encourage and support bonding with your baby. Your hospital may have you and your baby to stay in the same room (rooming in) during your hospital stay to encourage successful bonding and breastfeeding. You may be encouraged to cough and breathe deeply often. This helps to prevent lung problems. If you have a catheter draining your urine, it will be removed as soon as possible after your procedure. Summary Cesarean birth, or cesarean delivery, is the surgical delivery of a baby through an incision in the abdomen and the uterus. Follow instructions from your health care provider about eating or drinking restrictions before the  procedure. You will have some pain after the procedure. Medicines will be available to help control your pain. Hospital staff will encourage and support bonding with your baby after the procedure. Your hospital may have you and your baby to stay in the same room (rooming in) during your hospital stay to encourage successful bonding and breastfeeding. This information is not intended to replace advice given to you by your health care provider. Make sure you discuss any questions you have with your health care provider. Document Revised: 05/26/2020 Document Reviewed: 04/03/2018 Elsevier Patient Education  2022 [employer].

## 2021-06-18 ENCOUNTER — Other Ambulatory Visit: Payer: Self-pay | Admitting: Obstetrics

## 2021-06-18 ENCOUNTER — Other Ambulatory Visit: Payer: Self-pay

## 2021-06-18 ENCOUNTER — Other Ambulatory Visit: Payer: Medicaid Other

## 2021-06-18 ENCOUNTER — Ambulatory Visit: Payer: Medicaid Other | Attending: Obstetrics and Gynecology

## 2021-06-18 ENCOUNTER — Ambulatory Visit (HOSPITAL_BASED_OUTPATIENT_CLINIC_OR_DEPARTMENT_OTHER): Payer: Medicaid Other | Admitting: Obstetrics and Gynecology

## 2021-06-18 VITALS — BP 127/73 | HR 104 | Temp 98.3°F | Ht 66.0 in | Wt 199.5 lb

## 2021-06-18 DIAGNOSIS — O365931 Maternal care for other known or suspected poor fetal growth, third trimester, fetus 1: Secondary | ICD-10-CM

## 2021-06-18 DIAGNOSIS — O30043 Twin pregnancy, dichorionic/diamniotic, third trimester: Secondary | ICD-10-CM

## 2021-06-18 DIAGNOSIS — Z3A33 33 weeks gestation of pregnancy: Secondary | ICD-10-CM

## 2021-06-18 DIAGNOSIS — Z8659 Personal history of other mental and behavioral disorders: Secondary | ICD-10-CM

## 2021-06-18 DIAGNOSIS — O0993 Supervision of high risk pregnancy, unspecified, third trimester: Secondary | ICD-10-CM

## 2021-06-18 DIAGNOSIS — O0990 Supervision of high risk pregnancy, unspecified, unspecified trimester: Secondary | ICD-10-CM

## 2021-06-18 DIAGNOSIS — O99013 Anemia complicating pregnancy, third trimester: Secondary | ICD-10-CM

## 2021-06-18 NOTE — Progress Notes (Signed)
Kendra Ward 12/25/1995 [redacted]w[redacted]d   Fetus B Non-Stress Test Interpretation for 06/18/21  Started @ 1454 Completed @ 1530  Indication: DiDi Twins  Fetal Heart Rate Fetus B Mode: External Baseline Rate (B): 150 BPM (Girl) Variability: Moderate Accelerations: 10 x 10 Decelerations: None    Kendra Ward 11-01-1995 [redacted]w[redacted]d  Fetus A Non-Stress Test Interpretation for 06/18/21  Indication: IUGR  Fetal Heart Rate A Mode: External Baseline Rate (A): 155 bpm (Boy) Variability: Moderate Accelerations: 10 x 10 Decelerations: None Multiple birth?: Yes     Interpretation (Fetal Testing) Nonstress Test Interpretation: Reactive (Per Dr.Shankar)

## 2021-06-19 ENCOUNTER — Inpatient Hospital Stay: Payer: Medicaid Other

## 2021-06-19 VITALS — BP 114/78 | HR 85 | Temp 98.1°F | Resp 16

## 2021-06-19 DIAGNOSIS — O99013 Anemia complicating pregnancy, third trimester: Secondary | ICD-10-CM

## 2021-06-19 LAB — CERVICOVAGINAL ANCILLARY ONLY
Chlamydia: NEGATIVE
Comment: NEGATIVE
Comment: NEGATIVE
Comment: NORMAL
Neisseria Gonorrhea: NEGATIVE
Trichomonas: NEGATIVE

## 2021-06-19 MED ORDER — CYANOCOBALAMIN 1000 MCG/ML IJ SOLN
1000.0000 ug | Freq: Once | INTRAMUSCULAR | Status: AC
  Administered 2021-06-19: 1000 ug via INTRAMUSCULAR
  Filled 2021-06-19: qty 1

## 2021-06-19 MED ORDER — SODIUM CHLORIDE 0.9 % IV SOLN
510.0000 mg | Freq: Once | INTRAVENOUS | Status: AC
  Administered 2021-06-19: 510 mg via INTRAVENOUS
  Filled 2021-06-19: qty 510

## 2021-06-19 MED ORDER — ACETAMINOPHEN 325 MG PO TABS
650.0000 mg | ORAL_TABLET | Freq: Once | ORAL | Status: AC
  Administered 2021-06-19: 650 mg via ORAL
  Filled 2021-06-19: qty 2

## 2021-06-19 MED ORDER — DIPHENHYDRAMINE HCL 25 MG PO CAPS
25.0000 mg | ORAL_CAPSULE | Freq: Once | ORAL | Status: AC
  Administered 2021-06-19: 25 mg via ORAL
  Filled 2021-06-19: qty 1

## 2021-06-19 MED ORDER — SODIUM CHLORIDE 0.9 % IV SOLN
Freq: Once | INTRAVENOUS | Status: AC
  Filled 2021-06-19: qty 250

## 2021-06-19 NOTE — Progress Notes (Signed)
Kendra Ward tolerated her first Feraheme infusion without any complications. Pre-meds of Benadryl and Tylenol was given as ordered. She was monitored for 30 minutes after the infusion and vital signs was stable at discharge.

## 2021-06-19 NOTE — Patient Instructions (Signed)
Ferumoxytol Injection What is this medication? FERUMOXYTOL (FER ue MOX i tol) treats low levels of iron in your body (iron deficiency anemia). Iron is a mineral that plays an important role in making red blood cells, which carry oxygen from your lungs to the rest of your body. This medicine may be used for other purposes; ask your health care provider or pharmacist if you have questions. COMMON BRAND NAME(S): Feraheme What should I tell my care team before I take this medication? They need to know if you have any of these conditions: Anemia not caused by low iron levels High levels of iron in the blood Magnetic resonance imaging (MRI) test scheduled An unusual or allergic reaction to iron, other medications, foods, dyes, or preservatives Pregnant or trying to get pregnant Breast-feeding How should I use this medication? This medication is for injection into a vein. It is given in a hospital or clinic setting. Talk to your care team the use of this medication in children. Special care may be needed. Overdosage: If you think you have taken too much of this medicine contact a poison control center or emergency room at once. NOTE: This medicine is only for you. Do not share this medicine with others. What if I miss a dose? It is important not to miss your dose. Call your care team if you are unable to keep an appointment. What may interact with this medication? Other iron products This list may not describe all possible interactions. Give your health care provider a list of all the medicines, herbs, non-prescription drugs, or dietary supplements you use. Also tell them if you smoke, drink alcohol, or use illegal drugs. Some items may interact with your medicine. What should I watch for while using this medication? Visit your care team regularly. Tell your care team if your symptoms do not start to get better or if they get worse. You may need blood work done while you are taking this  medication. You may need to follow a special diet. Talk to your care team. Foods that contain iron include: whole grains/cereals, dried fruits, beans, or peas, leafy green vegetables, and organ meats (liver, kidney). What side effects may I notice from receiving this medication? Side effects that you should report to your care team as soon as possible: Allergic reactions-skin rash, itching, hives, swelling of the face, lips, tongue, or throat Low blood pressure-dizziness, feeling faint or lightheaded, blurry vision Shortness of breath Side effects that usually do not require medical attention (report to your care team if they continue or are bothersome): Flushing Headache Joint pain Muscle pain Nausea Pain, redness, or irritation at injection site This list may not describe all possible side effects. Call your doctor for medical advice about side effects. You may report side effects to FDA at 1-800-FDA-1088. Where should I keep my medication? This medication is given in a hospital or clinic and will not be stored at home. NOTE: This sheet is a summary. It may not cover all possible information. If you have questions about this medicine, talk to your doctor, pharmacist, or health care provider.  2022 Elsevier/Gold Standard (2021-02-13 15:35:12)  

## 2021-06-21 LAB — CULTURE, BETA STREP (GROUP B ONLY): Strep Gp B Culture: NEGATIVE

## 2021-06-22 LAB — 28 WEEK RH+PANEL
Basophils Absolute: 0 10*3/uL (ref 0.0–0.2)
Basos: 0 %
EOS (ABSOLUTE): 0.2 10*3/uL (ref 0.0–0.4)
Eos: 2 %
Gestational Diabetes Screen: 109 mg/dL (ref 65–139)
HIV Screen 4th Generation wRfx: NONREACTIVE
Hematocrit: 28.9 % — ABNORMAL LOW (ref 34.0–46.6)
Hemoglobin: 9.5 g/dL — ABNORMAL LOW (ref 11.1–15.9)
Immature Grans (Abs): 0.3 10*3/uL — ABNORMAL HIGH (ref 0.0–0.1)
Immature Granulocytes: 2 %
Lymphocytes Absolute: 2.1 10*3/uL (ref 0.7–3.1)
Lymphs: 17 %
MCH: 28 pg (ref 26.6–33.0)
MCHC: 32.9 g/dL (ref 31.5–35.7)
MCV: 85 fL (ref 79–97)
Monocytes Absolute: 1.2 10*3/uL — ABNORMAL HIGH (ref 0.1–0.9)
Monocytes: 10 %
Neutrophils Absolute: 8.2 10*3/uL — ABNORMAL HIGH (ref 1.4–7.0)
Neutrophils: 69 %
Platelets: 301 10*3/uL (ref 150–450)
RBC: 3.39 x10E6/uL — ABNORMAL LOW (ref 3.77–5.28)
RDW: 15.6 % — ABNORMAL HIGH (ref 11.7–15.4)
RPR Ser Ql: NONREACTIVE
WBC: 12 10*3/uL — ABNORMAL HIGH (ref 3.4–10.8)

## 2021-06-22 LAB — HGB FRACTIONATION CASCADE
Hgb A2: 2.4 % (ref 1.8–3.2)
Hgb A: 97.6 % (ref 96.4–98.8)
Hgb F: 0 % (ref 0.0–2.0)
Hgb S: 0 %

## 2021-06-23 ENCOUNTER — Other Ambulatory Visit: Payer: Self-pay

## 2021-06-23 ENCOUNTER — Encounter: Payer: Self-pay | Admitting: Obstetrics and Gynecology

## 2021-06-23 ENCOUNTER — Ambulatory Visit (INDEPENDENT_AMBULATORY_CARE_PROVIDER_SITE_OTHER): Payer: Medicaid Other | Admitting: Obstetrics and Gynecology

## 2021-06-23 VITALS — BP 122/70 | Wt 199.0 lb

## 2021-06-23 DIAGNOSIS — O30043 Twin pregnancy, dichorionic/diamniotic, third trimester: Secondary | ICD-10-CM

## 2021-06-23 DIAGNOSIS — O365931 Maternal care for other known or suspected poor fetal growth, third trimester, fetus 1: Secondary | ICD-10-CM

## 2021-06-23 DIAGNOSIS — O0990 Supervision of high risk pregnancy, unspecified, unspecified trimester: Secondary | ICD-10-CM

## 2021-06-23 DIAGNOSIS — O99013 Anemia complicating pregnancy, third trimester: Secondary | ICD-10-CM

## 2021-06-23 NOTE — Progress Notes (Signed)
Routine Prenatal Care Visit  Subjective  Kendra Ward is a 25 y.o. G2P0010 at [redacted]w[redacted]d being seen today for ongoing prenatal care.  She is currently monitored for the following issues for this high-risk pregnancy and has LLQ pain; Left ovarian cyst; Twin gestation in third trimester; Supervision of high risk pregnancy, antepartum; Intrauterine growth restriction (IUGR) affecting care of mother; Anemia in pregnancy, third trimester; Abdominal pain in pregnancy, third trimester; History of depression; and Decreased fetal movement on their problem list.  ----------------------------------------------------------------------------------- Patient reports no complaints.   Contractions: Not present. Vag. Bleeding: None.  Movement: Present. Leaking Fluid denies.  ----------------------------------------------------------------------------------- The following portions of the patient's history were reviewed and updated as appropriate: allergies, current medications, past family history, past medical history, past social history, past surgical history and problem list. Problem list updated.  Objective  Blood pressure 122/70, weight 199 lb (90.3 kg), last menstrual period 10/29/2020. Pregravid weight 150 lb (68 kg) Total Weight Gain 49 lb (22.2 kg) Urinalysis: Urine Protein    Urine Glucose    Fetal Status: Fetal Heart Rate (bpm): 138/153   Movement: Present     General:  Alert, oriented and cooperative. Patient is in no acute distress.  Skin: Skin is warm and dry. No rash noted.   Cardiovascular: Normal heart rate noted  Respiratory: Normal respiratory effort, no problems with respiration noted  Abdomen: Soft, gravid, appropriate for gestational age. Pain/Pressure: Absent     Pelvic:  Cervical exam deferred        Extremities: Normal range of motion.     Mental Status: Normal mood and affect. Normal behavior. Normal judgment and thought content.   Assessment   25 y.o. G2P0010 at [redacted]w[redacted]d by   08/05/2021, by Last Menstrual Period presenting for routine prenatal visit  Plan   March 2022 Problems (from 12/17/20 to present)     Problem Noted Resolved   History of depression 06/11/2021 by Tresea Mall, CNM No   Intrauterine growth restriction (IUGR) affecting care of mother 05/15/2021 by Conard Novak, MD No   Supervision of high risk pregnancy, antepartum 12/24/2020 by Conard Novak, MD No   Overview Addendum 06/17/2021 12:56 PM by Natale Milch, MD      Nursing Staff Provider  Office Location  [facility] Dating   LMP = 8 wk [place]  Language  English Anatomy US  complete  Flu Vaccine   Genetic Screen  NIPS: normal XY/XX  TDaP vaccine   06/11/2021 Hgb A1C or  GTT  Third trimester :   Covid vaccinated   LAB RESULTS   Rhogam  Not needed Blood Type O/Positive/-- (04/14 1137)   Feeding Plan Breast Antibody Negative (04/14 1137)  Contraception  Rubella 5.03 (04/14 1137)  Circumcision  RPR nonreactive  Pediatrician   HBsAg Negative (04/14 1137)   Support Person  FOB HIV negative  Prenatal Classes  Varicella Immune    GBS    BTL Consent     VBAC Consent  Pap 2022 NIL    Hgb Electro      CF      SMA         Dichorionic Di amniotic pregnancy Baby A - IUGR Maternal Anemia in pregnancy      Twin gestation in third trimester 12/08/2020 by Nadara Mustard, MD No   Overview Addendum 06/17/2021 11:47 AM by Natale Milch, MD    Dichorionic Diamniotic Twin Pregnancy Plan  Mom should take 60-120mg  of elemental iron a day as well as  1mg  of folic acid  18-20 wks:  Anatomy Ultrasound 20-Delivery: Growth Ultrasounds every 2-4 weeks** 36 wks: Initiate NSTs (initiate earlier if indicated)  Delivery No complications: 38 0/7 - 38 6/7 Isolated IUGR: 36 0/7- 37 6/7 IUGR with abnormal dopplers, hypertension, etc:  32 0/7 - 34 6/7      [redacted] weeks gestation of pregnancy 06/14/2021 by 08/14/2021, CNM 06/17/2021 by 08/17/2021, MD        Preterm labor symptoms  and general obstetric precautions including but not limited to vaginal bleeding, contractions, leaking of fluid and fetal movement were reviewed in detail with the patient. Please refer to After Visit Summary for other counseling recommendations.   - continue current care. - NST/BPP/growth per MFM - Delivery at 36 weeks, already scheduled. - consider BMTZ week 35.  Return in about 1 week (around 06/30/2021) for Keep all previously scheduled appts.   07/02/2021, MD, Thomasene Mohair OB[facility], [facility] 06/23/2021 2:52 PM

## 2021-06-25 ENCOUNTER — Other Ambulatory Visit: Payer: Self-pay

## 2021-06-25 ENCOUNTER — Other Ambulatory Visit: Payer: Self-pay | Admitting: Maternal & Fetal Medicine

## 2021-06-25 ENCOUNTER — Ambulatory Visit (HOSPITAL_BASED_OUTPATIENT_CLINIC_OR_DEPARTMENT_OTHER): Payer: Medicaid Other

## 2021-06-25 ENCOUNTER — Ambulatory Visit: Payer: Medicaid Other | Attending: Maternal & Fetal Medicine

## 2021-06-25 VITALS — BP 115/68 | HR 89 | Temp 97.9°F | Ht 66.0 in | Wt 199.5 lb

## 2021-06-25 DIAGNOSIS — O365931 Maternal care for other known or suspected poor fetal growth, third trimester, fetus 1: Secondary | ICD-10-CM

## 2021-06-25 DIAGNOSIS — O30043 Twin pregnancy, dichorionic/diamniotic, third trimester: Secondary | ICD-10-CM

## 2021-06-25 DIAGNOSIS — O0990 Supervision of high risk pregnancy, unspecified, unspecified trimester: Secondary | ICD-10-CM

## 2021-06-25 DIAGNOSIS — O365930 Maternal care for other known or suspected poor fetal growth, third trimester, not applicable or unspecified: Secondary | ICD-10-CM

## 2021-06-25 DIAGNOSIS — Z3A34 34 weeks gestation of pregnancy: Secondary | ICD-10-CM

## 2021-06-25 DIAGNOSIS — Z8659 Personal history of other mental and behavioral disorders: Secondary | ICD-10-CM

## 2021-06-25 NOTE — Progress Notes (Unsigned)
Kendra Ward 04-27-1996 [redacted]w[redacted]d   Indication: DiDi Twins Started @ 1030 Stopped @ 1200 for BPP Interpretation Non Reactive-Per Dr. Grace Bushy Indication: IUGR "A"-Boy  Fetal Heart Rate A Mode: External Baseline Rate (A): 145 bpm Multiple birth?: Yes Interpretation (Fetal Testing) Nonstress Test Interpretation: Non-reactive Fetal Heart Rate B Baseline 145 bpm

## 2021-06-26 ENCOUNTER — Inpatient Hospital Stay: Payer: Medicaid Other

## 2021-06-26 VITALS — BP 115/69 | HR 94 | Temp 96.7°F | Resp 18

## 2021-06-26 DIAGNOSIS — O99013 Anemia complicating pregnancy, third trimester: Secondary | ICD-10-CM

## 2021-06-26 MED ORDER — SODIUM CHLORIDE 0.9 % IV SOLN
Freq: Once | INTRAVENOUS | Status: AC
  Filled 2021-06-26: qty 250

## 2021-06-26 MED ORDER — SODIUM CHLORIDE 0.9 % IV SOLN
510.0000 mg | Freq: Once | INTRAVENOUS | Status: AC
  Administered 2021-06-26: 510 mg via INTRAVENOUS
  Filled 2021-06-26: qty 510

## 2021-06-26 NOTE — Patient Instructions (Signed)
[facility] [place] [facility]   Discharge Instructions: Thank you for choosing [facility] to provide your oncology and hematology care.  If you have a lab appointment with the Cancer Center, please go directly to the Cancer Center and check in at the registration area.  We strive to give you quality time with your provider. You may need to reschedule your appointment if you arrive late (15 or more minutes).  Arriving late affects you and other patients whose appointments are after yours.  Also, if you miss three or more appointments without notifying the office, you may be dismissed from the clinic at the provider's discretion.      For prescription refill requests, have your pharmacy contact our office and allow 72 hours for refills to be completed.    Today you received the following: Feraheme.      BELOW ARE SYMPTOMS THAT SHOULD BE REPORTED IMMEDIATELY: *FEVER GREATER THAN 100.4 F (38 C) OR HIGHER *CHILLS OR SWEATING *NAUSEA AND VOMITING THAT IS NOT CONTROLLED WITH YOUR NAUSEA MEDICATION *UNUSUAL SHORTNESS OF BREATH *UNUSUAL BRUISING OR BLEEDING *URINARY PROBLEMS (pain or burning when urinating, or frequent urination) *BOWEL PROBLEMS (unusual diarrhea, constipation, pain near the anus) TENDERNESS IN MOUTH AND THROAT WITH OR WITHOUT PRESENCE OF ULCERS (sore throat, sores in mouth, or a toothache) UNUSUAL RASH, SWELLING OR PAIN  UNUSUAL VAGINAL DISCHARGE OR ITCHING   Items with * indicate a potential emergency and should be followed up as soon as possible or go to the Emergency Department if any problems should occur.  Should you have questions after your visit or need to cancel or reschedule your appointment, please contact [facility]  [phone] and follow the prompts.  Office hours are 8:00 a.m. to 4:30 p.m. Monday - Friday. Please note that voicemails left after 4:00 p.m. may not be returned until the following  business day.  We are closed weekends and major holidays. You have access to a nurse at all times for urgent questions. Please call the main number to the clinic [phone] and follow the prompts.  For any non-urgent questions, you may also contact your provider using MyChart. We now offer e-Visits for anyone 18 and older to request care online for non-urgent symptoms. For details visit [email or web address].   Also download the MyChart app! Go to the app store, search "MyChart", open the app, select [facility], and log in with your MyChart username and password.  Due to Covid, a mask is required upon entering the hospital/clinic. If you do not have a mask, one will be given to you upon arrival. For doctor visits, patients may have 1 support person aged 18 or older with them. For treatment visits, patients cannot have anyone with them due to current Covid guidelines and our immunocompromised population.  

## 2021-06-26 NOTE — Progress Notes (Signed)
Clarified supportive therapy plan orders for today with MD, Dr. Orlie Dakin. Per MD, Dr. Orlie Dakin order: patient should receive Feraheme only today; do not give Tylenol and Benadryl pre-medications today; also do not give Vitamin B-12 injection today.

## 2021-06-30 ENCOUNTER — Encounter: Payer: Self-pay | Admitting: Obstetrics and Gynecology

## 2021-06-30 ENCOUNTER — Inpatient Hospital Stay
Admission: AD | Admit: 2021-06-30 | Discharge: 2021-07-03 | DRG: 787 | Disposition: A | Discharge status: Home / Self Care | Payer: Medicaid Other | Attending: Obstetrics and Gynecology | Admitting: Obstetrics and Gynecology

## 2021-06-30 ENCOUNTER — Other Ambulatory Visit: Payer: Self-pay

## 2021-06-30 ENCOUNTER — Inpatient Hospital Stay: Payer: Medicaid Other | Admitting: Anesthesiology

## 2021-06-30 ENCOUNTER — Encounter
Admission: AD | Disposition: A | Discharge status: Home / Self Care | Payer: Self-pay | Source: Ambulatory Visit | Attending: Obstetrics and Gynecology

## 2021-06-30 ENCOUNTER — Other Ambulatory Visit: Payer: Self-pay | Admitting: Obstetrics and Gynecology

## 2021-06-30 ENCOUNTER — Ambulatory Visit (HOSPITAL_BASED_OUTPATIENT_CLINIC_OR_DEPARTMENT_OTHER): Payer: Medicaid Other

## 2021-06-30 DIAGNOSIS — O0990 Supervision of high risk pregnancy, unspecified, unspecified trimester: Secondary | ICD-10-CM

## 2021-06-30 DIAGNOSIS — O9081 Anemia of the puerperium: Secondary | ICD-10-CM | POA: Diagnosis not present

## 2021-06-30 DIAGNOSIS — Z8659 Personal history of other mental and behavioral disorders: Secondary | ICD-10-CM

## 2021-06-30 DIAGNOSIS — O30003 Twin pregnancy, unspecified number of placenta and unspecified number of amniotic sacs, third trimester: Secondary | ICD-10-CM | POA: Diagnosis present

## 2021-06-30 DIAGNOSIS — O365931 Maternal care for other known or suspected poor fetal growth, third trimester, fetus 1: Secondary | ICD-10-CM | POA: Insufficient documentation

## 2021-06-30 DIAGNOSIS — Z3A34 34 weeks gestation of pregnancy: Secondary | ICD-10-CM | POA: Diagnosis not present

## 2021-06-30 DIAGNOSIS — O4103X0 Oligohydramnios, third trimester, not applicable or unspecified: Secondary | ICD-10-CM

## 2021-06-30 DIAGNOSIS — O321XX1 Maternal care for breech presentation, fetus 1: Secondary | ICD-10-CM | POA: Diagnosis present

## 2021-06-30 DIAGNOSIS — O9903 Anemia complicating the puerperium: Secondary | ICD-10-CM | POA: Diagnosis not present

## 2021-06-30 DIAGNOSIS — O30043 Twin pregnancy, dichorionic/diamniotic, third trimester: Secondary | ICD-10-CM | POA: Diagnosis present

## 2021-06-30 DIAGNOSIS — O365932 Maternal care for other known or suspected poor fetal growth, third trimester, fetus 2: Secondary | ICD-10-CM

## 2021-06-30 DIAGNOSIS — Z87891 Personal history of nicotine dependence: Secondary | ICD-10-CM | POA: Diagnosis not present

## 2021-06-30 DIAGNOSIS — Z20822 Contact with and (suspected) exposure to covid-19: Secondary | ICD-10-CM | POA: Diagnosis present

## 2021-06-30 DIAGNOSIS — Z3A35 35 weeks gestation of pregnancy: Secondary | ICD-10-CM | POA: Insufficient documentation

## 2021-06-30 DIAGNOSIS — Z7982 Long term (current) use of aspirin: Secondary | ICD-10-CM | POA: Diagnosis not present

## 2021-06-30 DIAGNOSIS — O321XX2 Maternal care for breech presentation, fetus 2: Secondary | ICD-10-CM | POA: Insufficient documentation

## 2021-06-30 DIAGNOSIS — O4103X1 Oligohydramnios, third trimester, fetus 1: Secondary | ICD-10-CM | POA: Diagnosis not present

## 2021-06-30 DIAGNOSIS — D62 Acute posthemorrhagic anemia: Secondary | ICD-10-CM | POA: Diagnosis not present

## 2021-06-30 LAB — CBC
HCT: 34.2 % — ABNORMAL LOW (ref 36.0–46.0)
Hemoglobin: 10.9 g/dL — ABNORMAL LOW (ref 12.0–15.0)
MCH: 28.8 pg (ref 26.0–34.0)
MCHC: 31.9 g/dL (ref 30.0–36.0)
MCV: 90.2 fL (ref 80.0–100.0)
Platelets: 326 10*3/uL (ref 150–400)
RBC: 3.79 MIL/uL — ABNORMAL LOW (ref 3.87–5.11)
RDW: 22.4 % — ABNORMAL HIGH (ref 11.5–15.5)
WBC: 15.3 10*3/uL — ABNORMAL HIGH (ref 4.0–10.5)
nRBC: 0.9 % — ABNORMAL HIGH (ref 0.0–0.2)

## 2021-06-30 LAB — URINE DRUG SCREEN, QUALITATIVE (ARMC ONLY)
Amphetamines, Ur Screen: NOT DETECTED
Barbiturates, Ur Screen: NOT DETECTED
Benzodiazepine, Ur Scrn: NOT DETECTED
Cannabinoid 50 Ng, Ur ~~LOC~~: NOT DETECTED
Cocaine Metabolite,Ur ~~LOC~~: NOT DETECTED
MDMA (Ecstasy)Ur Screen: NOT DETECTED
Methadone Scn, Ur: NOT DETECTED
Opiate, Ur Screen: NOT DETECTED
Phencyclidine (PCP) Ur S: NOT DETECTED
Tricyclic, Ur Screen: NOT DETECTED

## 2021-06-30 LAB — RESP PANEL BY RT-PCR (FLU A&B, COVID) ARPGX2
Influenza A by PCR: NEGATIVE
Influenza B by PCR: NEGATIVE
SARS Coronavirus 2 by RT PCR: NEGATIVE

## 2021-06-30 LAB — TYPE AND SCREEN
ABO/RH(D): O POS
Antibody Screen: NEGATIVE

## 2021-06-30 SURGERY — Surgical Case
Anesthesia: Choice

## 2021-06-30 MED ORDER — SOD CITRATE-CITRIC ACID 500-334 MG/5ML PO SOLN: 30.0000 mL | ORAL | Status: AC

## 2021-06-30 MED ORDER — ROCURONIUM BROMIDE 10 MG/ML (PF) SYRINGE
PREFILLED_SYRINGE | INTRAVENOUS | Status: AC
  Filled 2021-06-30: qty 10

## 2021-06-30 MED ORDER — BUPIVACAINE 0.25 % ON-Q PUMP DUAL CATH 400 ML
400.0000 mL | INJECTION | Status: DC
  Filled 2021-06-30: qty 400

## 2021-06-30 MED ORDER — OXYTOCIN-SODIUM CHLORIDE 30-0.9 UT/500ML-% IV SOLN
INTRAVENOUS | Status: DC | PRN
Start: 2021-06-30 — End: 2021-06-30
  Administered 2021-06-30: 99 mL via INTRAVENOUS
  Administered 2021-06-30: 1 mL via INTRAVENOUS

## 2021-06-30 MED ORDER — SOD CITRATE-CITRIC ACID 500-334 MG/5ML PO SOLN
ORAL | Status: AC
  Administered 2021-06-30: 30 mL via ORAL
  Filled 2021-06-30: qty 15

## 2021-06-30 MED ORDER — SODIUM CHLORIDE 0.9% FLUSH: 3.0000 mL | INTRAVENOUS | Status: DC | PRN

## 2021-06-30 MED ORDER — ONDANSETRON HCL 4 MG/2ML IJ SOLN: 4.0000 mg | Freq: Three times a day (TID) | INTRAMUSCULAR | Status: DC | PRN

## 2021-06-30 MED ORDER — SODIUM CHLORIDE 0.9 % IV SOLN
INTRAVENOUS | Status: DC | PRN
  Administered 2021-06-30: 40 ug/min via INTRAVENOUS

## 2021-06-30 MED ORDER — NALBUPHINE HCL 10 MG/ML IJ SOLN: 5.0000 mg | Freq: Once | INTRAMUSCULAR | Status: DC | PRN

## 2021-06-30 MED ORDER — ACETAMINOPHEN 10 MG/ML IV SOLN
INTRAVENOUS | Status: AC
  Filled 2021-06-30: qty 100

## 2021-06-30 MED ORDER — LACTATED RINGERS IV SOLN: INTRAVENOUS | Status: DC

## 2021-06-30 MED ORDER — DIPHENHYDRAMINE HCL 50 MG/ML IJ SOLN: 12.5000 mg | INTRAMUSCULAR | Status: DC | PRN

## 2021-06-30 MED ORDER — FENTANYL CITRATE (PF) 100 MCG/2ML IJ SOLN
INTRAMUSCULAR | Status: DC | PRN
  Administered 2021-06-30: 15 ug via INTRATHECAL

## 2021-06-30 MED ORDER — NALBUPHINE HCL 10 MG/ML IJ SOLN: 5.0000 mg | INTRAMUSCULAR | Status: DC | PRN

## 2021-06-30 MED ORDER — DEXAMETHASONE SODIUM PHOSPHATE 10 MG/ML IJ SOLN
INTRAMUSCULAR | Status: AC
  Filled 2021-06-30: qty 1

## 2021-06-30 MED ORDER — KETOROLAC TROMETHAMINE 30 MG/ML IJ SOLN
30.0000 mg | Freq: Four times a day (QID) | INTRAMUSCULAR | Status: AC | PRN
  Administered 2021-06-30 – 2021-07-01 (×2): 30 mg via INTRAVENOUS
  Filled 2021-06-30 (×2): qty 1

## 2021-06-30 MED ORDER — KETOROLAC TROMETHAMINE 30 MG/ML IJ SOLN: 30.0000 mg | Freq: Four times a day (QID) | INTRAMUSCULAR | Status: AC | PRN

## 2021-06-30 MED ORDER — BUPIVACAINE IN DEXTROSE 0.75-8.25 % IT SOLN
INTRATHECAL | Status: DC | PRN
  Administered 2021-06-30: 1.6 mL via INTRATHECAL

## 2021-06-30 MED ORDER — NALOXONE HCL 4 MG/10ML IJ SOLN
1.0000 ug/kg/h | INTRAVENOUS | Status: DC | PRN
  Filled 2021-06-30: qty 5

## 2021-06-30 MED ORDER — DIPHENHYDRAMINE HCL 25 MG PO CAPS: 25.0000 mg | ORAL_CAPSULE | ORAL | Status: DC | PRN

## 2021-06-30 MED ORDER — PHENYLEPHRINE HCL (PRESSORS) 10 MG/ML IV SOLN
INTRAVENOUS | Status: AC
  Filled 2021-06-30: qty 1

## 2021-06-30 MED ORDER — FENTANYL CITRATE (PF) 100 MCG/2ML IJ SOLN
INTRAMUSCULAR | Status: AC
  Filled 2021-06-30: qty 2

## 2021-06-30 MED ORDER — MORPHINE SULFATE (PF) 0.5 MG/ML IJ SOLN
INTRAMUSCULAR | Status: DC | PRN
  Administered 2021-06-30: .1 mg via INTRATHECAL

## 2021-06-30 MED ORDER — BUPIVACAINE HCL (PF) 0.5 % IJ SOLN
INTRAMUSCULAR | Status: AC
  Filled 2021-06-30: qty 30

## 2021-06-30 MED ORDER — BUPIVACAINE HCL 0.5 % IJ SOLN
20.0000 mL | INTRAMUSCULAR | Status: DC
  Filled 2021-06-30: qty 20

## 2021-06-30 MED ORDER — SALINE SPRAY 0.65 % NA SOLN
1.0000 | NASAL | Status: DC | PRN
  Filled 2021-06-30: qty 44

## 2021-06-30 MED ORDER — NALOXONE HCL 0.4 MG/ML IJ SOLN: 0.4000 mg | INTRAMUSCULAR | Status: DC | PRN

## 2021-06-30 MED ORDER — ONDANSETRON HCL 4 MG/2ML IJ SOLN
INTRAMUSCULAR | Status: AC
  Filled 2021-06-30: qty 2

## 2021-06-30 MED ORDER — OXYTOCIN-SODIUM CHLORIDE 30-0.9 UT/500ML-% IV SOLN
INTRAVENOUS | Status: AC
  Filled 2021-06-30: qty 500

## 2021-06-30 MED ORDER — ACETAMINOPHEN 500 MG PO TABS
1000.0000 mg | ORAL_TABLET | Freq: Four times a day (QID) | ORAL | Status: AC
  Administered 2021-07-01 (×3): 1000 mg via ORAL
  Filled 2021-06-30 (×3): qty 2

## 2021-06-30 MED ORDER — CEFAZOLIN SODIUM-DEXTROSE 2-4 GM/100ML-% IV SOLN
2.0000 g | INTRAVENOUS | Status: AC
  Administered 2021-06-30: 2 g via INTRAVENOUS
  Filled 2021-06-30: qty 100

## 2021-06-30 MED ORDER — BUPIVACAINE HCL (PF) 0.5 % IJ SOLN
INTRAMUSCULAR | Status: DC | PRN
  Administered 2021-06-30: 10 mL

## 2021-06-30 MED ORDER — ONDANSETRON HCL 4 MG/2ML IJ SOLN
INTRAMUSCULAR | Status: DC | PRN
  Administered 2021-06-30: 4 mg via INTRAVENOUS

## 2021-06-30 MED ORDER — MORPHINE SULFATE (PF) 0.5 MG/ML IJ SOLN
INTRAMUSCULAR | Status: AC
  Filled 2021-06-30: qty 10

## 2021-06-30 SURGICAL SUPPLY — 32 items
CATH KIT ON-Q SILVERSOAK 5IN (CATHETERS) ×4 IMPLANT
DERMABOND ADVANCED (GAUZE/BANDAGES/DRESSINGS) ×1
DERMABOND ADVANCED .7 DNX12 (GAUZE/BANDAGES/DRESSINGS) ×1 IMPLANT
DRSG OPSITE POSTOP 4X10 (GAUZE/BANDAGES/DRESSINGS) ×2 IMPLANT
DRSG TELFA 3X8 NADH (GAUZE/BANDAGES/DRESSINGS) ×2 IMPLANT
ELECT CAUTERY BLADE 6.4 (BLADE) ×2 IMPLANT
ELECT REM PT RETURN 9FT ADLT (ELECTROSURGICAL) ×2
ELECTRODE REM PT RTRN 9FT ADLT (ELECTROSURGICAL) ×1 IMPLANT
GAUZE SPONGE 4X4 12PLY STRL (GAUZE/BANDAGES/DRESSINGS) ×2 IMPLANT
GLOVE SURG ENC MOIS LTX SZ7 (GLOVE) ×2 IMPLANT
GLOVE SURG UNDER LTX SZ7.5 (GLOVE) ×2 IMPLANT
GOWN STRL REUS W/ TWL LRG LVL3 (GOWN DISPOSABLE) ×3 IMPLANT
GOWN STRL REUS W/TWL LRG LVL3 (GOWN DISPOSABLE) ×3
MANIFOLD NEPTUNE II (INSTRUMENTS) ×2 IMPLANT
MAT PREVALON FULL STRYKER (MISCELLANEOUS) ×2 IMPLANT
NS IRRIG 1000ML POUR BTL (IV SOLUTION) ×2 IMPLANT
PACK C SECTION AR (MISCELLANEOUS) ×2 IMPLANT
PAD OB MATERNITY 4.3X12.25 (PERSONAL CARE ITEMS) ×4 IMPLANT
PAD PREP 24X41 OB/GYN DISP (PERSONAL CARE ITEMS) ×2 IMPLANT
PENCIL SMOKE EVACUATOR (MISCELLANEOUS) ×2 IMPLANT
SCRUB EXIDINE 4% CHG 4OZ (MISCELLANEOUS) ×2 IMPLANT
STAPLER INSORB 30 2030 C-SECTI (MISCELLANEOUS) ×2 IMPLANT
STRIP CLOSURE SKIN 1/2X4 (GAUZE/BANDAGES/DRESSINGS) ×2 IMPLANT
SUT MNCRL 4-0 (SUTURE) ×1
SUT MNCRL 4-0 27XMFL (SUTURE) ×1
SUT PDS AB 1 TP1 96 (SUTURE) ×2 IMPLANT
SUT PLAIN GUT 0 (SUTURE) IMPLANT
SUT VIC AB 0 CTX 36 (SUTURE) ×2
SUT VIC AB 0 CTX36XBRD ANBCTRL (SUTURE) ×2 IMPLANT
SUTURE MNCRL 4-0 27XMF (SUTURE) ×1 IMPLANT
SWABSTK COMLB BENZOIN TINCTURE (MISCELLANEOUS) ×2 IMPLANT
WATER STERILE IRR 500ML POUR (IV SOLUTION) ×2 IMPLANT

## 2021-06-30 NOTE — Progress Notes (Deleted)
Pt requests epidural Anesthesia notified [id number] Ginger Carne CRNA at [facility] came to assist.  Epidural placed at 1443 by Foye Clock Test dose at 1444 Maternal HR 106 Bolus 1 at 1448 Bolus 2 1452 Drip started at 1453

## 2021-06-30 NOTE — Progress Notes (Unsigned)
Pt transported to [facility] right after MFM ultrsound today via wheelchair by myself.  Report given to Huntley Dec at the bedside.

## 2021-06-30 NOTE — Op Note (Signed)
Preoperative Diagnosis: 1) 25 y.o. [id number] at [redacted]w[redacted]d Di/Di twin pregnancy 2) Intrauterine growth restriction on fetus A 3) Oligohydramnios fetus A 4) Breech presentation fetus A  Postoperative Diagnosis: 1) 25 y.o. [id number] at [redacted]w[redacted]d Di/Di twin pregnancy 2) Intrauterine growth restriction on fetus A 3) Oligohydramnios fetus A with apparent SROM prior to delivery 4) Breech presentation fetus A  Operation Performed: Primary low transverse C-section via pfannenstiel skin incision  Indication: Patient being followed for Di/Di twin pregnancy affected by IUGR of twin A.  On BPP today fetus A with no measurable amniotic fluid.  Anesthesia: Spinal  Primary Surgeon: Vena Austria, MD  Preoperative Antibiotics: 2g Ancef  Estimated Blood Loss: 956 mL  IV Fluids:   Drains or Tubes: Foley to gravity drainage, ON-Q catheter system  Implants: none  Specimens Removed: none  Complications: none  Intraoperative Findings:  Normal tubes ovaries and uterus.  Delivery resulted in the birth of two liveborn infants.  On hysterotomy entry free floating umbilical cord noted with no intact bag noted for baby A   Yuna, Pizzolato [[id number]]  8    Meryl, Hubers Van Meter [[id number]]  8    APGAR (5 MINS):    Pavielle, Biggar [[id number]]  8    Antwanette, [place] [[id number]]  8    APGAR (10 MINS):    Chriselda, Leppert Knox [[id number]]     Brae, Schaafsma [[id number]]    , weight    Patient Condition: stable  Procedure in Detail:  Patient was taken to the operating room were she was administered regional anesthesia.  She was positioned in the supine position, prepped and draped in the  Usual sterile fashion.  Prior to proceeding with the case a time out was performed and the level of anesthetic was checked and noted to be adequate.  Utilizing the scalpel a pfannenstiel skin incision was made 2cm above the pubic symphysis and carried down  sharply to the the level of the rectus fascia.  The fascia was incised in the midline using the scalpel and then extended using mayo scissors.  The superior border of the rectus fascia was grasped with two Kocher clamps and the underlying rectus muscles were dissected of the fascia using blunt dissection.  The median raphae was incised using Mayo scissors.   The inferior border of the rectus fascia was dissected of the rectus muscles in a similar fashion.  The midline was identified, the peritoneum was entered bluntly and expanded using manual tractions.  The uterus was noted to be in a none rotated position.  Next the bladder blade was placed retracting the bladder caudally.  A bladder flap was not created.  A low transverse incision was scored on the lower uterine segment.  The hysterotomy was entered bluntly using the operators finger.  The hysterotomy incision was extended using manual traction.  The operators hand was placed within the hysterotomy position noting the fetus to be within the  frank breech position.  There was no intact amniotic sac noted for fetus A with free floating umbilical cord.  The breech was grasped, flexed, brought to the incision, delivered to the level of the head without difficulty.  The arms delivered spontaneously.  The head was flexed and delivered. .  The remainder of the body delivered with ease.  The infant was suctioned, cord was clamped and cut before handing off to the awaiting neonatologist.  Following resuscitation of baby A amniotomy was performed on baby B noting clear fluid.  The  fetus was noted to be in the OA position.  The vertex was grasped, flexed, and delivered atraumatically using fundal pressure.  The remainder of the body delivered with ease.  The placenta was delivered using manual extraction.  The uterus was exteriorized, wiped clean of clots and debris using two moist laps.  The hysterotomy was closed using a two layer closure of 0 Vicryl, with the first  being a running locked, the second a vertical imbricating.  The uterus was returned to the abdomen.  The peritoneal gutters were wiped clean of clots and debris using two moist laps.  The hysterotomy incision was re-inspected noted to be hemostatic.  The rectus muscles were inspected noted to be hemostatic.  The superior border of the rectus fascia was grasped with a Kocher clamp.  The ON-Q trocars were then placed 4cm above the superior border of the incision and tunneled subfascially.  The introducers were removed and the catheters were threaded through the sleeves after which the sleeves were removed.  The fascia was closed using a looped #1 PDS in a running fashion taking 1cm by 1cm bites.  The subcutaneous tissue was irrigated using warm saline, hemostasis achieved using the bovie.  The subcutaneous dead space was less than 3cm and was not closed.  The skin was closed using Insorb staples.  Sponge needle and instrument counts were corrects times two.  The patient tolerated the procedure well and was taken to the recovery room in stable condition.

## 2021-06-30 NOTE — Anesthesia Preprocedure Evaluation (Addendum)
Anesthesia Evaluation  Patient identified by MRN, date of birth, ID band Patient awake    Reviewed: Allergy & Precautions, NPO status , Patient's Chart, lab work & pertinent test results  History of Anesthesia Complications Negative for: history of anesthetic complications (no family hx)  Airway Mallampati: II  TM Distance: >3 FB Neck ROM: Full   Comment: 11/2020 - [facility] 3 grade 1 view Dental   Pulmonary neg pulmonary ROS, former smoker,    Pulmonary exam normal        Cardiovascular negative cardio ROS Normal cardiovascular exam     Neuro/Psych  Headaches,    GI/Hepatic negative GI ROS, Neg liver ROS,   Endo/Other  negative endocrine ROS  Renal/GU negative Renal ROS  negative genitourinary   Musculoskeletal negative musculoskeletal ROS (+)   Abdominal   Peds  Hematology  (+) anemia ,   Anesthesia Other Findings 34 weeks twin gestation with severe oligohydramnios, for C/S  Reproductive/Obstetrics                            Anesthesia Physical Anesthesia Plan  ASA: 2 and emergent  Anesthesia Plan:    Post-op Pain Management:    Induction:   PONV Risk Score and Plan: 4 or greater  Airway Management Planned:   Additional Equipment:   Intra-op Plan:   Post-operative Plan:   Informed Consent: I have reviewed the patients History and Physical, chart, labs and discussed the procedure including the risks, benefits and alternatives for the proposed anesthesia with the patient or authorized representative who has indicated his/her understanding and acceptance.       Plan Discussed with:   Anesthesia Plan Comments: (Urgent C/S due to severe oligohydramnios, IUGR.  PO intake at noon)        Anesthesia Quick Evaluation

## 2021-06-30 NOTE — Anesthesia Postprocedure Evaluation (Signed)
Anesthesia Post Note  Patient: Kendra Ward  Procedure(s) Performed: CESAREAN SECTION MULTI-GESTATIONAL  Patient location during evaluation: PACU Anesthesia Type: Spinal Level of consciousness: oriented and awake and alert Pain management: pain level controlled Vital Signs Assessment: post-procedure vital signs reviewed and stable Respiratory status: spontaneous breathing, respiratory function stable and patient connected to nasal cannula oxygen Cardiovascular status: blood pressure returned to baseline and stable Postop Assessment: no headache, no backache and no apparent nausea or vomiting Anesthetic complications: no   No notable events documented.   Last Vitals:  Vitals:   06/30/21 2118 06/30/21 2119  BP:    Pulse: 95 89  Resp: (!) 23 (!) 24  Temp:    SpO2: 100% 100%    Last Pain:  Vitals:   06/30/21 2130  TempSrc:   PainSc: 7                  Traveon Louro M Danford Tat

## 2021-06-30 NOTE — Transfer of Care (Signed)
Immediate Anesthesia Transfer of Care Note  Patient: Lenda Kelp  Procedure(s) Performed: CESAREAN SECTION MULTI-GESTATIONAL  Patient Location: PACU  Anesthesia Type:Spinal  Level of Consciousness: awake, alert , oriented and patient cooperative  Airway & Oxygen Therapy: Patient Spontanous Breathing  Post-op Assessment: Report given to RN and Post -op Vital signs reviewed and stable  Post vital signs: Reviewed and stable  Last Vitals:  Vitals Value Taken Time  BP 112/57 06/30/21 1954  Temp    Pulse 102 06/30/21 2000  Resp 25 06/30/21 2000  SpO2 100 % 06/30/21 2000  Vitals shown include unvalidated device data.  Last Pain:  Vitals:   06/30/21 1625  TempSrc:   PainSc: 0-No pain         Complications: No notable events documented.

## 2021-06-30 NOTE — Progress Notes (Signed)
Kaley Allred- Relief Circulator. Relief count performed.

## 2021-06-30 NOTE — Anesthesia Procedure Notes (Signed)
Spinal  Patient location during procedure: OR End time: 06/30/2021 6:35 PM Reason for block: surgical anesthesia Staffing Performed: anesthesiologist  Anesthesiologist: Velna Hatchet, MD Preanesthetic Checklist Completed: patient identified, IV checked, site marked, risks and benefits discussed, surgical consent, monitors and equipment checked, pre-op evaluation and timeout performed Spinal Block Patient position: sitting Prep: Betadine Patient monitoring: heart rate, continuous pulse ox, blood pressure and cardiac monitor Approach: right paramedian Location: L4-5 Injection technique: single-shot Needle Needle type: Introducer and Pencil-Tip  Needle gauge: 24 G Assessment Sensory level: T4 Events: CSF return Additional Notes Negative paresthesia. Negative blood return. Positive free-flowing CSF. Expiration date of kit checked and confirmed. Patient tolerated procedure well, without complications. Two attempts.  Bupivacaine 0.75% w/dextrose 1.6 ml (lot [id number] exp 12/24) Fentanyl 15 mcg PF morphine 100 mcg

## 2021-06-30 NOTE — H&P (Signed)
Obstetric H&P   Chief Complaint: Oligohydramnios and IUGR fetus A of Di/Di twin gestation  Prenatal Care Provider: [facility]  History of Present Illness: 25 y.o. G2P0010 [redacted]w[redacted]d by 08/05/2021, by Last Menstrual Period presenting to L&D after being seen in MFM clinic today by Dr. Grace Bushy where she has been followed for Di/Di twin gestation with IUGR in twin A.  The patient is scheduled for delivery on 07/09/21 but given new findings of severe oligohydramnios recommendation is to proceed with delivery now.  Baby A is also in breech presentation.  The patient's pregnancy has also been complicated by anemia and she has received to iron infusion by hematology during the third trimester.    Growth scan today  A) 3lbs 5oz or 1494g c.w <1%ile S/D 3.2 preserved end diastolic flow but no measurable fluid, BPP 6/8 B) 6lbs 11oz or 3026g c/w 92%ile S/D 2.9 BPP 8/8  Pregravid weight 68 kg Total Weight Gain 22.2 kg  March 2022 Problems (from 12/17/20 to present)     Problem Noted Resolved   History of depression 06/11/2021 by Tresea Mall, CNM No   Intrauterine growth restriction (IUGR) affecting care of mother 05/15/2021 by Conard Novak, MD No   Supervision of high risk pregnancy, antepartum 12/24/2020 by Conard Novak, MD No   Overview Addendum 06/17/2021 12:56 PM by Natale Milch, MD      Nursing Staff Provider  Office Location  [facility] Dating   LMP = 8 wk [place]  Language  English Anatomy US  complete  Flu Vaccine   Genetic Screen  NIPS: normal XY/XX  TDaP vaccine   06/11/2021 Hgb A1C or  GTT  Third trimester :   Covid vaccinated   LAB RESULTS   Rhogam  Not needed Blood Type O/Positive/-- (04/14 1137)   Feeding Plan Breast Antibody Negative (04/14 1137)  Contraception  Rubella 5.03 (04/14 1137)  Circumcision  RPR nonreactive  Pediatrician   HBsAg Negative (04/14 1137)   Support Person  FOB HIV negative  Prenatal Classes  Varicella Immune    GBS    BTL Consent     VBAC Consent  Pap  2022 NIL    Hgb Electro      CF      SMA         Dichorionic Di amniotic pregnancy Baby A - IUGR Maternal Anemia in pregnancy      Twin gestation in third trimester 12/08/2020 by Nadara Mustard, MD No   Overview Addendum 06/17/2021 11:47 AM by Natale Milch, MD    Dichorionic Diamniotic Twin Pregnancy Plan  Mom should take 60-120mg  of elemental iron a day as well as 1mg  of folic acid  18-20 wks:  Anatomy Ultrasound 20-Delivery: Growth Ultrasounds every 2-4 weeks** 36 wks: Initiate NSTs (initiate earlier if indicated)  Delivery No complications: 38 0/7 - 38 6/7 Isolated IUGR: 36 0/7- 37 6/7 IUGR with abnormal dopplers, hypertension, etc:  32 0/7 - 34 6/7      [redacted] weeks gestation of pregnancy 06/14/2021 by 08/14/2021, CNM 06/17/2021 by 08/17/2021, MD        Review of Systems: 10 point review of systems negative unless otherwise noted in HPI  Past Medical History: Patient Active Problem List   Diagnosis Date Noted   IUGR (intrauterine growth restriction) affecting care of mother, third trimester, fetus 1 06/30/2021   Decreased fetal movement 06/14/2021   History of depression 06/11/2021   Anemia in pregnancy, third trimester 05/20/2021  Abdominal pain in pregnancy, third trimester    Intrauterine growth restriction (IUGR) affecting care of mother 05/15/2021   Supervision of high risk pregnancy, antepartum 12/24/2020      Nursing Staff Provider  Office Location  [facility] Dating   LMP = 8 wk [place]  Language  English Anatomy US  complete  Flu Vaccine   Genetic Screen  NIPS: normal XY/XX  TDaP vaccine   06/11/2021 Hgb A1C or  GTT  Third trimester :   Covid vaccinated   LAB RESULTS   Rhogam  Not needed Blood Type O/Positive/-- (04/14 1137)   Feeding Plan Breast Antibody Negative (04/14 1137)  Contraception  Rubella 5.03 (04/14 1137)  Circumcision  RPR nonreactive  Pediatrician   HBsAg Negative (04/14 1137)   Support Person  FOB HIV negative  Prenatal  Classes  Varicella Immune    GBS    BTL Consent     VBAC Consent  Pap 2022 NIL    Hgb Electro      CF      SMA         Dichorionic Di amniotic pregnancy Baby A - IUGR Maternal Anemia in pregnancy    Twin gestation in third trimester 12/08/2020    Dichorionic Diamniotic Twin Pregnancy Plan  Mom should take 60-120mg  of elemental iron a day as well as 1mg  of folic acid  18-20 wks:  Anatomy Ultrasound 20-Delivery: Growth Ultrasounds every 2-4 weeks** 36 wks: Initiate NSTs (initiate earlier if indicated)  Delivery No complications: 38 0/7 - 38 6/7 Isolated IUGR: 36 0/7- 37 6/7 IUGR with abnormal dopplers, hypertension, etc:  32 0/7 - 34 6/7    LLQ pain    Left ovarian cyst     Past Surgical History: Past Surgical History:  Procedure Laterality Date   DIAGNOSTIC LAPAROSCOPY WITH REMOVAL OF ECTOPIC PREGNANCY N/A 11/26/2020   Procedure: DIAGNOSTIC LAPAROSCOPY;  Surgeon: 11/28/2020, MD;  Location: [facility] ORS;  Service: Gynecology;  Laterality: N/A;    Past Obstetric History: # 1 - Date: 2012, Sex: None, Weight: None, GA: None, Delivery: None, Apgar1: None, Apgar5: None, Living: None, Birth Comments: None  # 2 - Date: None, Sex: None, Weight: None, GA: None, Delivery: None, Apgar1: None, Apgar5: None, Living: None, Birth Comments: None   Past Gynecologic History:  Family History: Family History  Problem Relation Age of Onset   Diabetes Maternal Uncle    Diabetes Maternal Grandmother    Diabetes Maternal Grandfather     Social History: Social History   Socioeconomic History   Marital status: Married    Spouse name: Damien   Number of children: Not on file   Years of education: Not on file   Highest education level: Not on file  Occupational History   Not on file  Tobacco Use   Smoking status: Former    Types: Cigarettes    Quit date: 11/29/2020    Years since quitting: 0.5   Smokeless tobacco: Never  Vaping Use   Vaping Use: Never used  Substance and  Sexual Activity   Alcohol use: Not Currently   Drug use: Yes    Types: Marijuana    Comment: 11/29/2020   Sexual activity: Yes    Partners: Male  Other Topics Concern   Not on file  Social History Narrative   Not on file   Social Determinants of Health   Financial Resource Strain: Not on file  Food Insecurity: Not on file  Transportation Needs: Not on file  Physical Activity: Not on file  Stress: Not on file  Social Connections: Not on file  Intimate Partner Violence: Not on file    Medications: Prior to Admission medications   Medication Sig Start Date End Date Taking? Authorizing Provider  aspirin 81 MG chewable tablet Chew 81 mg by mouth daily.   Yes [provider]  Butalbital-APAP-Caffeine [phone] MG capsule Take 1-2 capsules by mouth every 6 (six) hours as needed for headache. 03/27/21  Yes Nadara Mustard, MD  ferrous sulfate 325 (65 FE) MG tablet Take 325 mg by mouth daily with breakfast.   Yes [provider]  Prenatal Vit-Fe Fumarate-FA (MULTIVITAMIN-PRENATAL) 27-0.8 MG TABS tablet Take 1 tablet by mouth daily at 12 noon.   Yes [provider]    Allergies: Allergies  Allergen Reactions   Latex Hives and Itching    Physical Exam: Vitals: Last menstrual period 10/29/2020.  Urine Dip Protein: N/A  FHT: A 130, moderate, no accels, no decels B:145, moderate, + accels, no decels Toco: none  General: NAD HEENT: normocephalic, anicteric Pulmonary: No increased work of breathing Cardiovascular: RRR, distal pulses 2+ Abdomen: Gravid, non-tender Leopolds: Breech/Vtx Genitourinary: deferred Extremities: no edema, erythema, or tenderness Neurologic: Grossly intact Psychiatric: mood appropriate, affect full  Labs: No results found for this or any previous visit (from the past 24 hour(s)).  Assessment: 25 y.o. G2P0010 [redacted]w[redacted]d by 08/05/2021, by Last Menstrual Period with IUGR and oligohydramnios twin A Di/Di gestation  Plan: 1) The  patient was counseled regarding risk and benefits to proceeding with Cesarean section to expedite delivery.  Risk of cesarean section were discussed including risk of bleeding and need for potential intraoperative or postoperative blood transfusion with a rate of approximately 5% quoted for all Cesarean sections, risk of injury to adjacent organs including but not limited to bowl and bladder, the need for additional surgical procedures to address such injuries, and the risk of infection.  The risk of continued pregnancy given the findings on ultrasound discussed.  After consideration of options the patient is amenable to proceed with primary cesarean section for delivery.   2) Fetus - A: cat I B: cat I  3) PNL - Blood type O/Positive/-- (04/14 1137) / Anti-bodyscreen Negative (04/14 1137) / Rubella 5.03 (04/14 1137) / Varicella Immune / RPR Non Reactive (09/08 1121) / HBsAg Negative (04/14 1137) / HIV Non Reactive (09/08 1121)  GBS Negative/-- (09/07 1146)  4) Immunization History -  Immunization History  Administered Date(s) Administered   Moderna Sars-Covid-2 Vaccination 05/27/2020, 06/11/2020   Tdap 06/11/2021    5) Disposition -   Vena Austria, MD, [facility], [facility] 06/30/2021, 3:56 PM

## 2021-07-01 ENCOUNTER — Encounter: Payer: Self-pay | Admitting: Obstetrics and Gynecology

## 2021-07-01 ENCOUNTER — Other Ambulatory Visit: Admission: RE | Admit: 2021-07-01 | Payer: Medicaid Other | Source: Ambulatory Visit

## 2021-07-01 LAB — CBC
HCT: 27.6 % — ABNORMAL LOW (ref 36.0–46.0)
Hemoglobin: 9 g/dL — ABNORMAL LOW (ref 12.0–15.0)
MCH: 30.1 pg (ref 26.0–34.0)
MCHC: 32.6 g/dL (ref 30.0–36.0)
MCV: 92.3 fL (ref 80.0–100.0)
Platelets: 210 10*3/uL (ref 150–400)
RBC: 2.99 MIL/uL — ABNORMAL LOW (ref 3.87–5.11)
RDW: 22.4 % — ABNORMAL HIGH (ref 11.5–15.5)
WBC: 12.8 10*3/uL — ABNORMAL HIGH (ref 4.0–10.5)
nRBC: 0.5 % — ABNORMAL HIGH (ref 0.0–0.2)

## 2021-07-01 MED ORDER — KETOROLAC TROMETHAMINE 30 MG/ML IJ SOLN
30.0000 mg | Freq: Four times a day (QID) | INTRAMUSCULAR | Status: AC
  Administered 2021-07-01 – 2021-07-02 (×4): 30 mg via INTRAVENOUS
  Filled 2021-07-01 (×4): qty 1

## 2021-07-01 MED ORDER — OXYCODONE-ACETAMINOPHEN 5-325 MG PO TABS
1.0000 | ORAL_TABLET | ORAL | Status: DC | PRN
Start: 2021-07-01 — End: 2021-07-04
  Administered 2021-07-02 – 2021-07-03 (×3): 1 via ORAL
  Filled 2021-07-01: qty 2
  Filled 2021-07-01: qty 1
  Filled 2021-07-01: qty 2
  Filled 2021-07-01: qty 1

## 2021-07-01 MED ORDER — LACTATED RINGERS IV SOLN: INTRAVENOUS | Status: DC

## 2021-07-01 MED ORDER — IBUPROFEN 600 MG PO TABS
600.0000 mg | ORAL_TABLET | Freq: Four times a day (QID) | ORAL | Status: DC
  Administered 2021-07-02 – 2021-07-03 (×6): 600 mg via ORAL
  Filled 2021-07-01 (×7): qty 1

## 2021-07-01 MED ORDER — COCONUT OIL OIL
1.0000 "application " | TOPICAL_OIL | Status: DC | PRN
  Administered 2021-07-01: 1 via TOPICAL

## 2021-07-01 MED ORDER — WITCH HAZEL-GLYCERIN EX PADS: 1.0000 "application " | MEDICATED_PAD | CUTANEOUS | Status: DC | PRN

## 2021-07-01 MED ORDER — SIMETHICONE 80 MG PO CHEW
80.0000 mg | CHEWABLE_TABLET | Freq: Three times a day (TID) | ORAL | Status: DC
  Administered 2021-07-01 – 2021-07-03 (×6): 80 mg via ORAL
  Filled 2021-07-01 (×6): qty 1

## 2021-07-01 MED ORDER — OXYTOCIN-SODIUM CHLORIDE 30-0.9 UT/500ML-% IV SOLN: 2.5000 [IU]/h | INTRAVENOUS | Status: AC

## 2021-07-01 MED ORDER — SENNOSIDES-DOCUSATE SODIUM 8.6-50 MG PO TABS
2.0000 | ORAL_TABLET | ORAL | Status: DC
  Administered 2021-07-01 – 2021-07-02 (×2): 2 via ORAL
  Filled 2021-07-01 (×4): qty 2

## 2021-07-01 MED ORDER — MENTHOL 3 MG MT LOZG
1.0000 | LOZENGE | OROMUCOSAL | Status: DC | PRN
  Filled 2021-07-01: qty 9

## 2021-07-01 MED ORDER — PRENATAL MULTIVITAMIN CH
1.0000 | ORAL_TABLET | Freq: Every day | ORAL | Status: DC
  Administered 2021-07-01 – 2021-07-03 (×3): 1 via ORAL
  Filled 2021-07-01 (×3): qty 1

## 2021-07-01 MED ORDER — DIPHENHYDRAMINE HCL 25 MG PO CAPS: 25.0000 mg | ORAL_CAPSULE | Freq: Four times a day (QID) | ORAL | Status: DC | PRN

## 2021-07-01 MED ORDER — SIMETHICONE 80 MG PO CHEW
80.0000 mg | CHEWABLE_TABLET | ORAL | Status: DC | PRN
  Administered 2021-07-03: 80 mg via ORAL
  Filled 2021-07-01: qty 1

## 2021-07-01 MED ORDER — DIBUCAINE (PERIANAL) 1 % EX OINT: 1.0000 "application " | TOPICAL_OINTMENT | CUTANEOUS | Status: DC | PRN

## 2021-07-01 NOTE — Progress Notes (Addendum)
Obstetric Postpartum/PostOperative Daily Progress Note Subjective:  25 y.o. [id number] post-operative day # 1 status post primary cesarean delivery.  She is not yet ambulating, is tolerating po, is not yet voiding spontaneously.  Her pain is well controlled on PO pain medications and On Q pump. Her lochia is less than menses. She has been pumping. Both newborns are doing well in the SCN per patient.   Medications SCHEDULED MEDICATIONS   acetaminophen  1,000 mg Oral Q6H   ketorolac  30 mg Intravenous Q6H   Followed by   Melene Muller ON 07/02/2021] ibuprofen  600 mg Oral Q6H   prenatal multivitamin  1 tablet Oral Q1200   senna-docusate  2 tablet Oral Q24H   simethicone  80 mg Oral TID PC    MEDICATION INFUSIONS   lactated ringers Stopped (07/01/21 0921)   naLOXone (NARCAN) adult infusion for PRURITIS     oxytocin      PRN MEDICATIONS  coconut oil, witch hazel-glycerin **AND** dibucaine, diphenhydrAMINE **OR** diphenhydrAMINE, diphenhydrAMINE, ketorolac **OR** ketorolac, menthol-cetylpyridinium, nalbuphine **OR** nalbuphine, nalbuphine **OR** nalbuphine, naloxone **AND** sodium chloride flush, naLOXone (NARCAN) adult infusion for PRURITIS, ondansetron (ZOFRAN) IV, oxyCODONE-acetaminophen, simethicone, sodium chloride    Objective:   Vitals:   06/30/21 2340 07/01/21 0114 07/01/21 0429 07/01/21 0729  BP: 124/69 111/70 (!) 109/54 112/67  Pulse: 92 92 80 79  Resp: 18 20 20 17   Temp: 98.5 F (36.9 C) 98.6 F (37 C) 97.9 F (36.6 C) 97.7 F (36.5 C)  TempSrc: Oral Oral Oral Oral  SpO2: 99% 100% 100% 100%  Weight:      Height:        Current Vital Signs 24h Vital Sign Ranges  T 97.7 F (36.5 C) Temp  Avg: 98.3 F (36.8 C)  Min: 97.7 F (36.5 C)  Max: 98.6 F (37 C)  BP 112/67 BP  Min: 109/54  Max: 144/82  HR 79 Pulse  Avg: 87.4  Min: 78  Max: 102  RR 17 Resp  Avg: 19.3  Min: 12  Max: 30  SaO2 100 % Room Air SpO2  Avg: 99.7 %  Min: 89 %  Max: 100 %       24 Hour I/O Current Shift I/O   Time Ins Outs 09/20 0701 - 09/21 0700 In: -  Out: 2086 [Urine:950] 09/21 0701 - 09/21 1900 In: -  Out: 400 [Urine:400]   General: NAD Pulmonary: no increased work of breathing Abdomen: non-distended, non-tender, fundus firm at level of umbilicus Inc: Clean/dry/intact, On Q intact Extremities: no edema, no erythema, no tenderness  Labs:  Recent Labs  Lab 06/30/21 1546 07/01/21 0838  WBC 15.3* 12.8*  HGB 10.9* 9.0*  HCT 34.2* 27.6*  PLT 326 210     Assessment:   25 y.o. 22 postoperative day # 1 status post primary cesarean section  Plan:  1) Acute blood loss anemia - hemodynamically stable and asymptomatic - po ferrous sulfate  2) O POS / Rubella 5.03 (04/14 1137)/ Varicella Immune  3) TDAP status up to date  4) Breastfeeding  5) Contraception = oral progesterone-only contraceptive  6) Disposition: continue current care   03-01-1993, CNM 07/01/2021 10:02 AM

## 2021-07-02 ENCOUNTER — Ambulatory Visit: Payer: Medicaid Other | Admitting: Neurology

## 2021-07-02 ENCOUNTER — Encounter: Payer: Medicaid Other | Admitting: Obstetrics and Gynecology

## 2021-07-02 MED ORDER — BISACODYL 10 MG RE SUPP
10.0000 mg | Freq: Every day | RECTAL | Status: DC | PRN
Start: 2021-07-02 — End: 2021-07-04
  Administered 2021-07-03: 10 mg via RECTAL
  Filled 2021-07-02: qty 1

## 2021-07-02 NOTE — Progress Notes (Signed)
Subjective: Postpartum Day 2: Cesarean Delivery Patient reports tolerating PO, + flatus, and no problems voiding.    Objective: Vital signs in last 24 hours: Temp:  [98 F (36.7 C)-98.6 F (37 C)] 98.6 F (37 C) (09/22 0744) Pulse Rate:  [83-91] 91 (09/22 0744) Resp:  [18-19] 18 (09/22 0744) BP: (110-125)/(60-72) 116/72 (09/22 0744) SpO2:  [99 %-100 %] 100 % (09/22 0744)  Physical Exam:  General: alert, cooperative, and no distress Lochia: appropriate Uterine Fundus: firm Incision: healing well, no significant drainage DVT Evaluation: No evidence of DVT seen on physical exam. Negative Homan's sign.  Recent Labs    06/30/21 1546 07/01/21 0838  HGB 10.9* 9.0*  HCT 34.2* 27.6*    Assessment/Plan: Status post Cesarean section. Doing well postoperatively.  Continue current care.  Mirna Mires 07/02/2021, 11:16 AM

## 2021-07-02 NOTE — Lactation Note (Signed)
This note was copied from a baby's chart. Lactation Consultation Note  Patient Name: Kendra Ward INOMV'E Date: 07/02/2021 Reason for consult: Initial assessment;Primapara;NICU baby;Late-preterm 34-36.6wks Age:26 hours  Initial lactation visit. Mom is 39hrs post c-section delivery to twins who are in the special care nursery.  Mom was set-up with DEBP at bedside soon after delivery, educated on use and frequency by nursing staff and has been doing well. Mom is at beside with twins in SCN often, and working on putting Girl B to the breast whenever possible.  LC reviewed with mom comfort of breastfeeding and pumping. Mom reports no pain or discomfort, or tenderness. She is pumping routinely, currently no output- LC provided reassurance that this is normal and gave encouragement to continue with her efforts.  Mom had no questions or concerns at this time. Lactation to follow-up as needed for mom and twins.  Maternal Data    Feeding Mother's Current Feeding Choice: Breast Milk and Formula Nipple Type: Nfant Slow Flow (purple)  LATCH Score                    Lactation Tools Discussed/Used Tools: Pump Breast pump type: Double-Electric Breast Pump Pump Education: Setup, frequency, and cleaning;Milk Storage (By RN) Reason for Pumping: 34wk; SCN Pumping frequency: q 3 hrs  Interventions Interventions: DEBP;Education  Discharge    Consult Status Consult Status: Follow-up Date: 07/02/21 Follow-up type: Call as needed    Danford Bad 07/02/2021, 10:40 AM

## 2021-07-03 DIAGNOSIS — Z3A34 34 weeks gestation of pregnancy: Secondary | ICD-10-CM

## 2021-07-03 MED ORDER — OXYCODONE HCL 5 MG PO TABS: 5.0000 mg | ORAL_TABLET | Freq: Four times a day (QID) | ORAL | 0 refills | Status: AC | PRN

## 2021-07-03 MED ORDER — FLEET ENEMA 7-19 GM/118ML RE ENEM
1.0000 | ENEMA | Freq: Once | RECTAL | Status: AC
  Administered 2021-07-03: 1 via RECTAL

## 2021-07-03 NOTE — Discharge Instructions (Signed)

## 2021-07-03 NOTE — Progress Notes (Signed)
RN in room to check on pt and see if enemas produced a BM and any relief; pt in bed and appears comfortable; RN asked if pt was able to have a BM; pt said "yes, I feel so much better"

## 2021-07-03 NOTE — Lactation Note (Addendum)
WIC cannot give mom an electric pump until next Tues, 9/27, Mom loaned [employer] breast pump until then or 1 wk, rental agreement completed and rental info given to Pharmacologist for payment by [facility].  Mom given a copy of rental agreement.  Mom instructed to pump breasts at least 8 x /24 hrs.

## 2021-07-03 NOTE — Lactation Note (Signed)
Mom does not have an electric breast pump for home use, to be d/c'd today, has [place] WIC, WIC referral faxed to [place] [facility], mom plans on calling [facility] today to see about getting an electric pump for home use.

## 2021-07-03 NOTE — Progress Notes (Signed)
All discharge teaching completed; pt has copy of discharge instructions; pt already has follow up appointment; pt requested on-q pump be removed by RN prior to discharge; pt escorted to medical mall by RN via wheelchair; pt going home with significant other (babies still patients here in [facility])

## 2021-07-03 NOTE — Discharge Summary (Signed)
OB Discharge Summary     Patient Name: Kendra Ward DOB: 1996/07/01 MRN: [id number]  Date of admission: 06/30/2021 Delivering provider: Vena Austria, MD Date of Delivery: 06/30/2021  Date of discharge: 07/03/2021  Admitting diagnosis: IUGR (intrauterine growth restriction) affecting care of mother, third trimester, fetus 1 [O36.5931] Intrauterine pregnancy: [redacted]w[redacted]d     Secondary diagnosis:  oligohydramnios     Discharge diagnosis: Preterm Pregnancy Delivered                                                                                                Post partum procedures: none  Augmentation: n/a  Complications: None  Hospital course:  Sceduled C/S   25 y.o. yo G2P0112 at [redacted]w[redacted]d was admitted to the hospital 06/30/2021 for cesarean section with the following indication: IUGR, oligohydramnios fetus 1 .Delivery details are as follows:  Membrane Rupture Time/Date:    Kendra Ward, Kendra Ward [[id number]]  7:00 PM    Kendra Ward, Kendra Ward [[id number]]  7:04 PM ,   Kendra Ward, Kendra Ward [[id number]]  06/30/2021    Kendra Ward, Kendra Ward [[id number]]  06/30/2021   Delivery Method:   Kendra Ward [[id number]]  C-Section, Low Transverse    Kendra Ward, Kendra Ward [[id number]]  C-Section, Low Transverse  Details of operation can be found in separate operative note.  Patient had an uncomplicated postpartum course.  She is ambulating, tolerating a regular diet, passing flatus, and urinating well. Patient is discharged home in stable condition on  07/03/21        Newborn Data: Birth date:   Kendra Ward, Kendra Ward [[id number]]  06/30/2021    Kendra Ward, Kendra Ward [[id number]]  06/30/2021  Birth time:   Kendra Ward, Kendra Ward [[id number]]  7:00 PM    Kendra Ward, Kendra Ward [[id number]]  7:05 PM  Gender:   Kendra Ward, Kendra Ward [[id number]]  Female    Kendra Ward, Kendra Ward [[id number]]  Female  Living status:   Kendra Ward, Kendra Ward [[id number]]  Living    Kendra Ward, Hagans Dontasia [[id number]]  Living  Apgars:   Tinika, Bucknam Abena [[id number]]  [address] [place] [[id number]]  8 ,   Devlin, Mcveigh [place] [[id number]]  8    Wyatt, Galvan [place] [[id number]]  8  Weight:   Rayah, Fines [[id number]]  1595 g    Lavona, Norsworthy Ferris [[id number]]  2840 g     Physical exam  Vitals:   07/02/21 0744 07/02/21 1751 07/02/21 2340 07/03/21 0736  BP: 116/72 109/67 131/79 115/67  Pulse: 91 100 96 87  Resp: 18 20 16 20   Temp: 98.6 F (37 C) 98.6 F (37 C) 97.9 F (36.6 C) 98.6 F (37 C)  TempSrc: Oral Oral Oral Oral  SpO2: 100% 99% 99% 99%  Weight:      Height:       General: alert, cooperative, and no distress Lochia: appropriate Uterine Fundus: firm Incision: Dressing is clean, dry, and intact, On Q intact DVT Evaluation: No evidence of DVT seen on physical exam.  Labs: Lab Results  Component Value Date   WBC 12.8 (H) 07/01/2021   HGB 9.0 (L) 07/01/2021  HCT 27.6 (L) 07/01/2021   MCV 92.3 07/01/2021   PLT 210 07/01/2021    Discharge instruction: per After Visit Summary.  Medications:  Allergies as of 07/03/2021       Reactions   Latex Hives, Itching        Medication List     STOP taking these medications    aspirin 81 MG chewable tablet   Butalbital-APAP-Caffeine 50-325-40 MG capsule       TAKE these medications    ferrous sulfate 325 (65 FE) MG tablet Take 325 mg by mouth daily with breakfast.   multivitamin-prenatal 27-0.8 MG Tabs tablet Take 1 tablet by mouth daily at 12 noon.   oxyCODONE 5 MG immediate release tablet Commonly known as: Roxicodone Take 1 tablet (5 mg total) by mouth every 6 (six) hours as needed for up to 5 days for severe pain.               Discharge Care Instructions  (From admission, onward)           Start     Ordered   07/03/21 0000  Discharge wound care:       Comments: Keep  incision dry, clean.   07/03/21 0853            Diet: routine diet  Activity: Advance as tolerated. Pelvic rest for 6 weeks.   Outpatient follow up:  Follow-up Information     Vena Austria, MD. Go in 1 week(s).   Specialty: Obstetrics and Gynecology Why: For wound re-check Contact information: [address] [place] [address] [phone]                   Postpartum contraception: Progesterone only pills Rhogam Given postpartum: no Rubella vaccine given postpartum: immune Varicella vaccine given postpartum: immune TDaP given antepartum or postpartum: antepartum  Newborn Data:   Adalea, Handler [[id number]]  Live born female  Birth Weight: 3 lb 8.3 oz (1595 g) APGAR: 8, 8  Newborn Delivery   Birth date/time: 06/30/2021 19:00:00 Delivery type: C-Section, Low Transverse Trial of labor: No C-section categorization: Primary       Dinita, Migliaccio [[id number]]  Live born female  Birth Weight: 6 lb 4.2 oz (2840 g) APGAR: 8, 8  Newborn Delivery   Birth date/time: 06/30/2021 19:05:00 Delivery type: C-Section, Low Transverse Trial of labor: No C-section categorization: Primary       Baby Feeding: Breast/newborns are in [facility]  Disposition:NICU  SIGNED:  Tresea Mall, CNM 07/03/2021 8:55 AM

## 2021-07-03 NOTE — Progress Notes (Signed)
Pt's significant other called RN to room; RN enters room and pt's significant other said "she wants an...what's it called?"; pt replied, from bathroom, "enema"; RN knocked on bathroom door and said "any relief from suppository?"; pt shook head "no"; pt appears very uncomfortable; pt said "can I get an enema?"; RN explained the process for enema; RN asked have you ever had an enema and pt responded "yes"; RN said it is a doctor's order and she'll have to talk to MD to get that order; pt said "yes, please"; RN walked to L&D unit to see if Dr. Jerene Pitch awake at the desk; RN saw MD in hallway talking to another RN and waited; RN said to Dr. Jerene Pitch "the patient in 334 that I called you about an hour ago is requesting an enema"; Dr. Jerene Pitch, "yes, order whatever she wants"; fleet enema ordered per MD verbal order

## 2021-07-03 NOTE — Progress Notes (Signed)
Pt calls RN to room; pt c/o of feeling "really constipated"; pt is already taking senakot daily and has had that two days in a row; pt ambulating in hallway to and from [facility] to see babies; RN asked if pt has been able to pass gas and pt replies "no, I haven't; I thought I was close one time but I haven't passed gas since my c/section"; RN offered prune juice and pt declined; RN did say there is a suppository called dulcolax and pt said "yes, I have used that at home"; RN confirmed that pt uncomfortable and would like a dulcolax suppository; pt said "yes"; RN called Dr. Jerene Pitch and MD did order suppository

## 2021-07-07 ENCOUNTER — Other Ambulatory Visit: Payer: Medicaid Other

## 2021-07-08 ENCOUNTER — Telehealth: Payer: Self-pay

## 2021-07-08 NOTE — Telephone Encounter (Signed)
Spoke w/patient. She advised she had a slight orange looking discharge. Advised discharge can vary I color. Monitor for heavy bleeding, foul odor, large clots with severe cramping.

## 2021-07-08 NOTE — Telephone Encounter (Signed)
Patient requesting call back regarding postpartum discharge. MY#[phone]

## 2021-07-09 ENCOUNTER — Encounter: Payer: Medicaid Other | Admitting: Obstetrics and Gynecology

## 2021-07-16 ENCOUNTER — Encounter: Payer: Self-pay | Admitting: Obstetrics and Gynecology

## 2021-07-16 ENCOUNTER — Ambulatory Visit (INDEPENDENT_AMBULATORY_CARE_PROVIDER_SITE_OTHER): Payer: Medicaid Other | Admitting: Obstetrics and Gynecology

## 2021-07-16 ENCOUNTER — Other Ambulatory Visit: Payer: Self-pay

## 2021-07-16 VITALS — BP 126/84 | Wt 172.0 lb

## 2021-07-16 DIAGNOSIS — Z09 Encounter for follow-up examination after completed treatment for conditions other than malignant neoplasm: Secondary | ICD-10-CM

## 2021-07-16 NOTE — Progress Notes (Signed)
   [name] presents post op from cesarean section  2weeks ago.  Subjective: She denies fever, chills, nausea, and vomiting. Eating a regular diet without difficulty. The patient is not having any pain.  Activity: increasing slowly. She denies issues with her incision.   Normal lochia.  Twins are still in NICU. Doing well.  Objective: BP 126/84   Wt 172 lb (78 kg)   BMI 27.76 kg/m   Physical Exam Constitutional:      General: She is not in acute distress.    Appearance: Normal appearance.  HENT:     Head: Normocephalic and atraumatic.  Eyes:     General: No scleral icterus.    Conjunctiva/sclera: Conjunctivae normal.  Abdominal:     General: There is no distension.     Palpations: Abdomen is soft. There is mass (uterine fundus at U-4, firm).     Tenderness: There is no abdominal tenderness. There is no guarding or rebound.     Comments: Incision: without erythema, induration, warmth, and tenderness. It is clean, dry, and intact.   Neurological:     General: No focal deficit present.     Mental Status: She is alert and oriented to person, place, and time.     Cranial Nerves: No cranial nerve deficit.  Psychiatric:        Mood and Affect: Mood normal.        Behavior: Behavior normal.        Judgment: Judgment normal.    Assessment: 25 y.o. s/p cesarean section progressing well  Plan: Patient has done well after surgery with no apparent complications.  I have discussed the post-operative course to date, and the expected progress moving forward.  The patient understands what complications to be concerned about.    Activity plan: increase slowly. Contraception: will wait until 6 weeks postpartum to decide.  Return in about 4 weeks (around 08/13/2021) for Six Week Postpartum AMS.  Thomasene Mohair, MD 07/16/2021 10:39 AM

## 2021-07-22 NOTE — Telephone Encounter (Signed)
done

## 2021-07-28 DIAGNOSIS — O4103X0 Oligohydramnios, third trimester, not applicable or unspecified: Secondary | ICD-10-CM

## 2021-08-13 ENCOUNTER — Ambulatory Visit (INDEPENDENT_AMBULATORY_CARE_PROVIDER_SITE_OTHER): Payer: Medicaid Other | Admitting: Obstetrics and Gynecology

## 2021-08-13 ENCOUNTER — Encounter: Payer: Self-pay | Admitting: Obstetrics and Gynecology

## 2021-08-13 ENCOUNTER — Other Ambulatory Visit: Payer: Self-pay

## 2021-08-13 MED ORDER — NORETHINDRONE 0.35 MG PO TABS: 1.0000 | ORAL_TABLET | Freq: Every day | ORAL | 11 refills | Status: DC

## 2021-08-13 NOTE — Progress Notes (Signed)
Postpartum Visit  Chief Complaint:  Chief Complaint  Patient presents with   Post-op Follow-up    6 wk postpartum - discuss contraception. RM 3    History of Present Illness: Patient is a 25 y.o. Q3R0076 presents for postpartum visit.  Date of delivery: 06/30/2021 Cesarean Section:  IUGR and oligohydramnios twins Pregnancy or labor problems:  no Any problems since the delivery:  no  Newborn Details:  SINGLETON :  1. BabyGender both female and female. Birth weight:   Maternal Details:  Breast or formula feeding: plans to breastfeed Intercourse: No  Contraception after delivery: No  Any bowel or bladder issues: No  Post partum depression/anxiety noted:  yes Edinburgh Post-Partum Depression Score:8 Date of last PAP: 3/16/2022no abnormalities   Review of Systems: Review of Systems  Constitutional: Negative.   Gastrointestinal: Negative.   Genitourinary: Negative.   Psychiatric/Behavioral: Negative.     The following portions of the patient's history were reviewed and updated as appropriate: allergies, current medications, past family history, past medical history, past social history, past surgical history, and problem list.  Past Medical History:  Past Medical History:  Diagnosis Date   Headache    No known health problems    Ovarian cyst 2022   L side-ruptured    Past Surgical History:  Past Surgical History:  Procedure Laterality Date   CESAREAN SECTION MULTI-GESTATIONAL N/A 06/30/2021   Procedure: CESAREAN SECTION MULTI-GESTATIONAL;  Surgeon: Vena Austria, MD;  Location: [facility] ORS;  Service: Obstetrics;  Laterality: N/A;   DIAGNOSTIC LAPAROSCOPY WITH REMOVAL OF ECTOPIC PREGNANCY N/A 11/26/2020   Procedure: DIAGNOSTIC LAPAROSCOPY;  Surgeon: Nadara Mustard, MD;  Location: [facility] ORS;  Service: Gynecology;  Laterality: N/A;    Family History:  Family History  Problem Relation Age of Onset   Diabetes Maternal Uncle    Diabetes Maternal Grandmother     Diabetes Maternal Grandfather     Social History:  Social History   Socioeconomic History   Marital status: Married    Spouse name: Damien   Number of children: Not on file   Years of education: Not on file   Highest education level: Not on file  Occupational History   Not on file  Tobacco Use   Smoking status: Former    Types: Cigarettes    Quit date: 11/29/2020    Years since quitting: 0.7   Smokeless tobacco: Never  Vaping Use   Vaping Use: Never used  Substance and Sexual Activity   Alcohol use: Not Currently   Drug use: Yes    Types: Marijuana    Comment: 11/29/2020   Sexual activity: Yes    Partners: Male    Comment: undecided  Other Topics Concern   Not on file  Social History Narrative   Not on file   Social Determinants of Health   Financial Resource Strain: Not on file  Food Insecurity: Not on file  Transportation Needs: Not on file  Physical Activity: Not on file  Stress: Not on file  Social Connections: Not on file  Intimate Partner Violence: Not on file    Allergies:  Allergies  Allergen Reactions   Latex Hives and Itching    Medications: Prior to Admission medications   Medication Sig Start Date End Date Taking? Authorizing Provider  ferrous sulfate 325 (65 FE) MG tablet Take 325 mg by mouth daily with breakfast.   Yes [provider]  Prenatal Vit-Fe Fumarate-FA (MULTIVITAMIN-PRENATAL) 27-0.8 MG TABS tablet Take 1 tablet by mouth  daily at 12 noon.   Yes [provider]    Physical Exam Blood pressure (!) 149/91, height 5' 6.5" (1.689 m), weight 168 lb (76.2 kg), currently breastfeeding.  General: NAD HEENT: normocephalic, anicteric Pulmonary: No increased work of breathing Abdomen: NABS, soft, non-tender, non-distended.  Umbilicus without lesions.  No hepatomegaly, splenomegaly or masses palpable. No evidence of hernia. Incision D/C/I Genitourinary:  External: Normal external female genitalia.  Normal urethral meatus,  normal  Bartholin's and Skene's glands.    Vagina: Normal vaginal mucosa, no evidence of prolapse.    Cervix: Grossly normal in appearance, no bleeding  Uterus: Non-enlarged, mobile, normal contour.  No CMT  Adnexa: ovaries non-enlarged, no adnexal masses  Rectal: deferred Extremities: no edema, erythema, or tenderness Neurologic: Grossly intact Psychiatric: mood appropriate, affect full   Edinburgh Postnatal Depression Scale - 08/13/21 1125       Edinburgh Postnatal Depression Scale:  In the Past 7 Days   I have been able to laugh and see the funny side of things. 0    I have looked forward with enjoyment to things. 1    I have blamed myself unnecessarily when things went wrong. 1    I have been anxious or worried for no good reason. 2    I have felt scared or panicky for no good reason. 1    Things have been getting on top of me. 1    I have been so unhappy that I have had difficulty sleeping. 0    I have felt sad or miserable. 1    I have been so unhappy that I have been crying. 1    The thought of harming myself has occurred to me. 0    Edinburgh Postnatal Depression Scale Total 8             Assessment: 25 y.o. T4H9622 presenting for 6 week postpartum visit  Plan: Problem List Items Addressed This Visit   None Visit Diagnoses     6 weeks postpartum follow-up    -  Primary       1) Contraception - Education given regarding options for contraception, as well as compatibility with breast feeding if applicable.  Patient plans on oral progesterone-only contraceptive for contraception.  2)  Pap - ASCCP guidelines and rational discussed.  ASCCP guidelines and rational discussed.  Patient opts for every 3 years screening interval  3) Patient underwent screening for postpartum depression with no signs of depression  4) Return in about 1 year (around 08/13/2022) for annual.   Vena Austria, MD, [facility], [facility] 08/13/2021, 11:26  AM

## 2021-08-18 ENCOUNTER — Encounter: Payer: Self-pay | Admitting: Oncology

## 2021-09-09 ENCOUNTER — Telehealth: Payer: Medicaid Other

## 2021-09-15 ENCOUNTER — Encounter: Payer: Self-pay | Admitting: Oncology

## 2021-09-28 ENCOUNTER — Encounter: Payer: Self-pay | Admitting: Oncology

## 2021-09-29 IMAGING — US US ABDOMEN LIMITED
1 series · 14 of 25 positions shown · non-contrast
Comparison: None.

CLINICAL DATA: Right upper quadrant pain, pregnant

EXAM:
ULTRASOUND ABDOMEN LIMITED RIGHT UPPER QUADRANT

[Series 1: us abdomen limited ruq (liver/gb) · 14 of 41 slices shown]
[im 1/41]
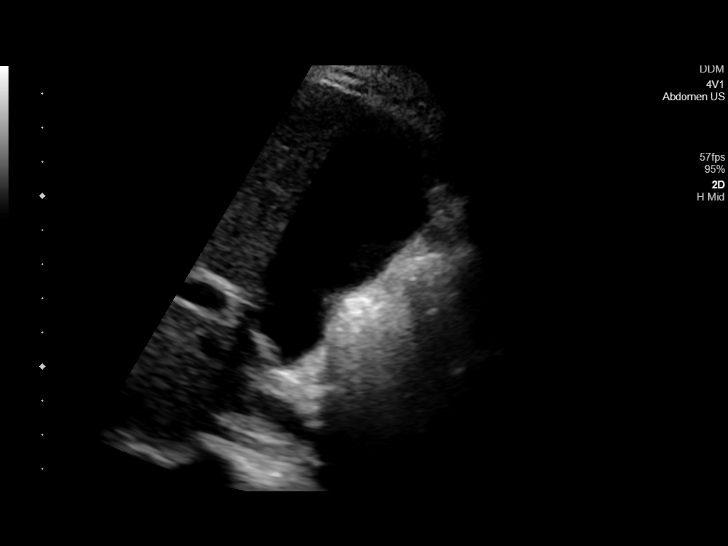
[im 4/41]
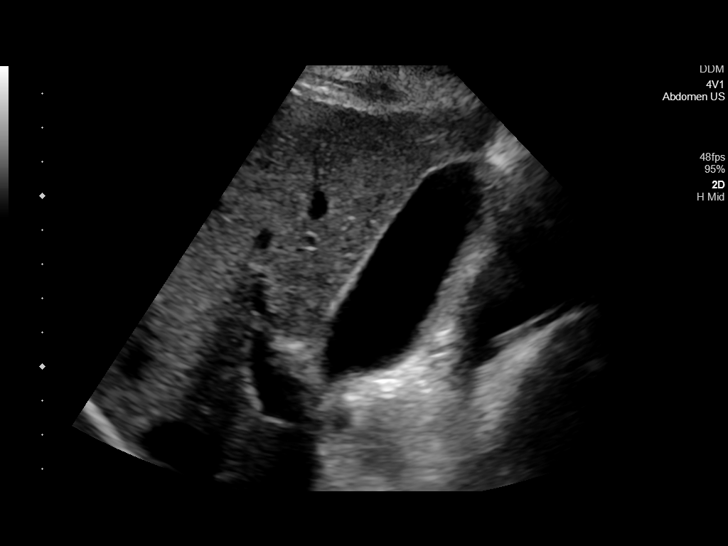
[im 7/41]
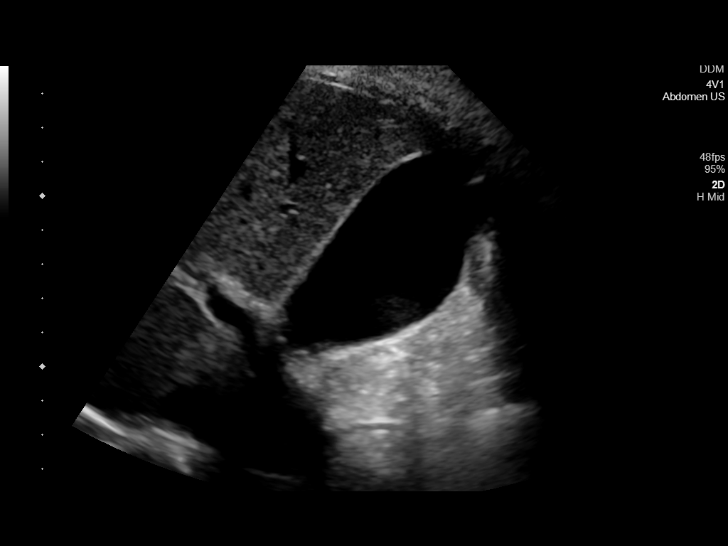
[im 11/41]
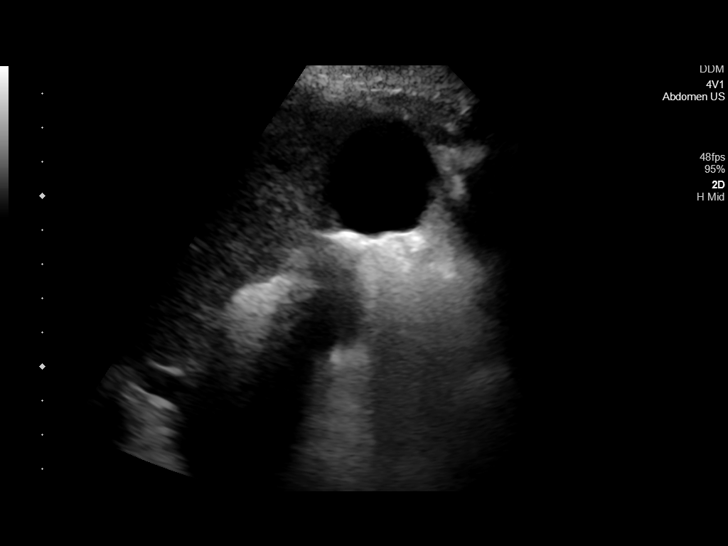
[im 14/41]
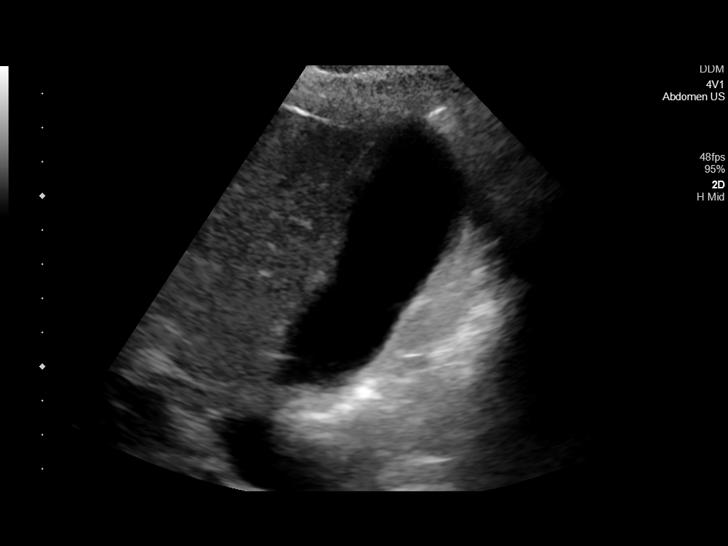
[im 16/41]
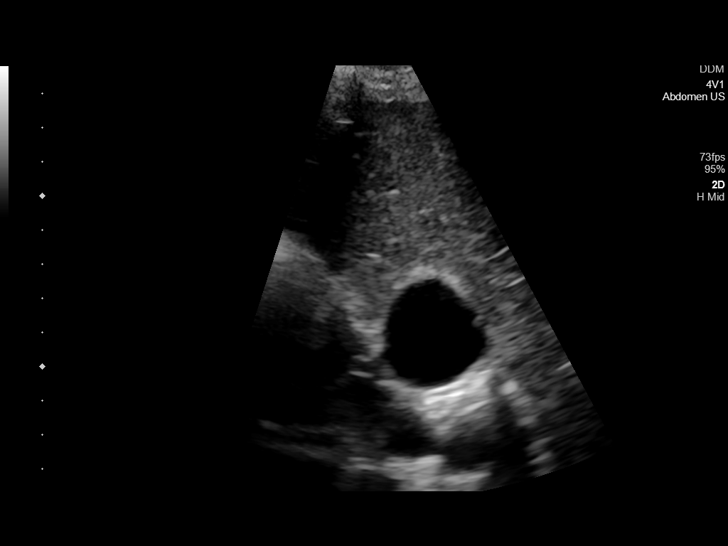
[im 19/41]
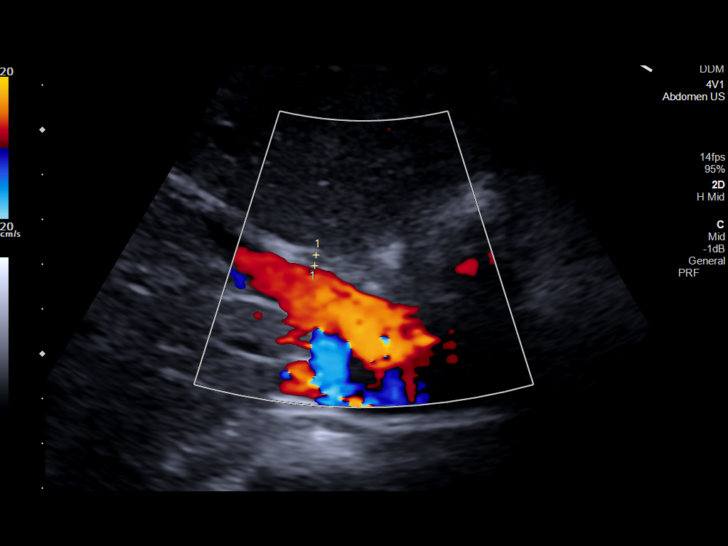
[im 22/41]
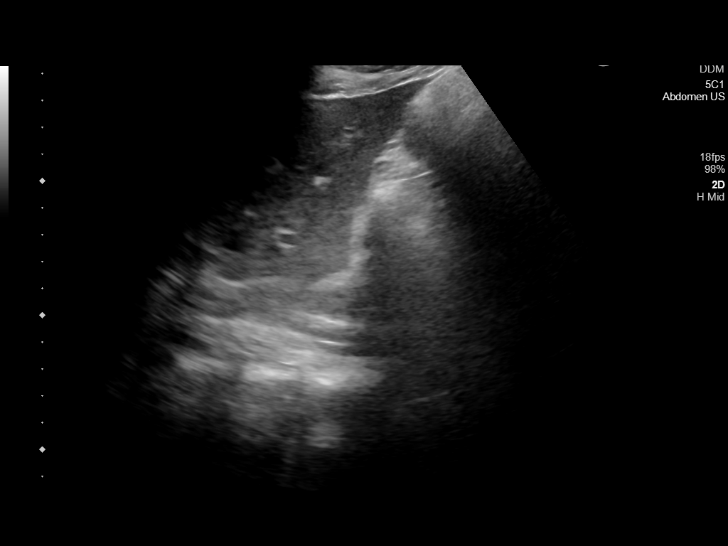
[im 26/41]
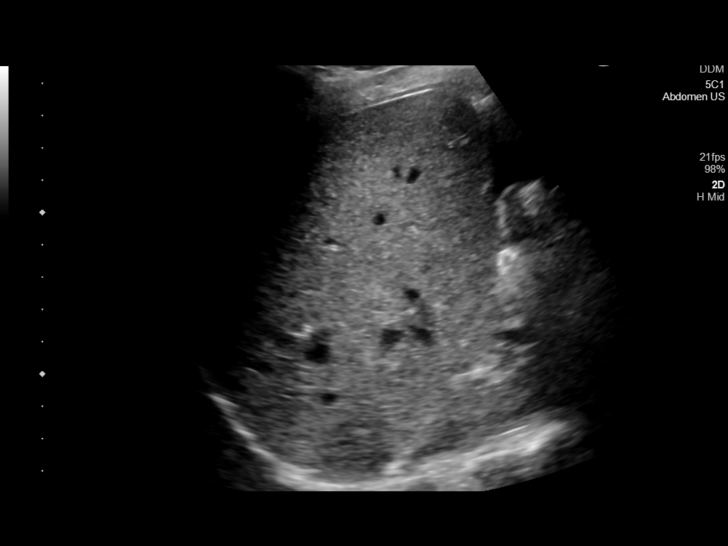
[im 27/41]
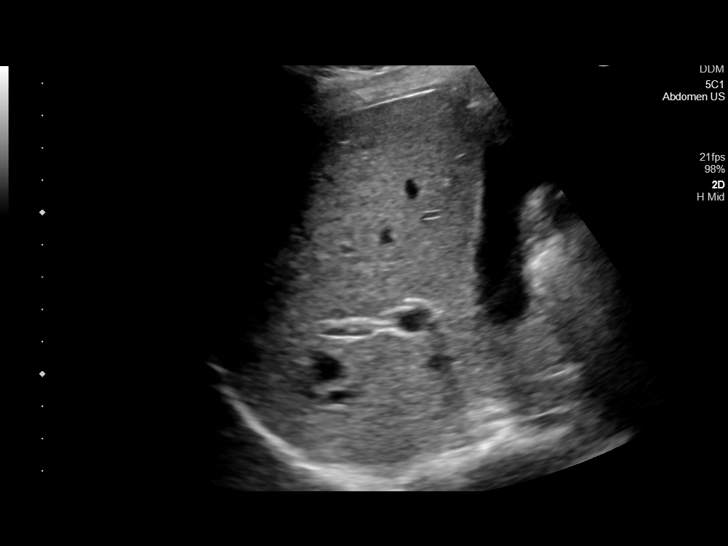
[im 31/41]
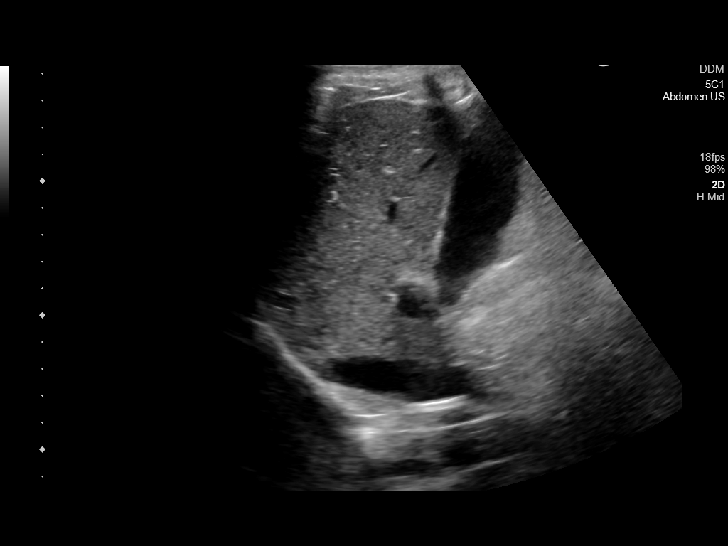
[im 34/41]
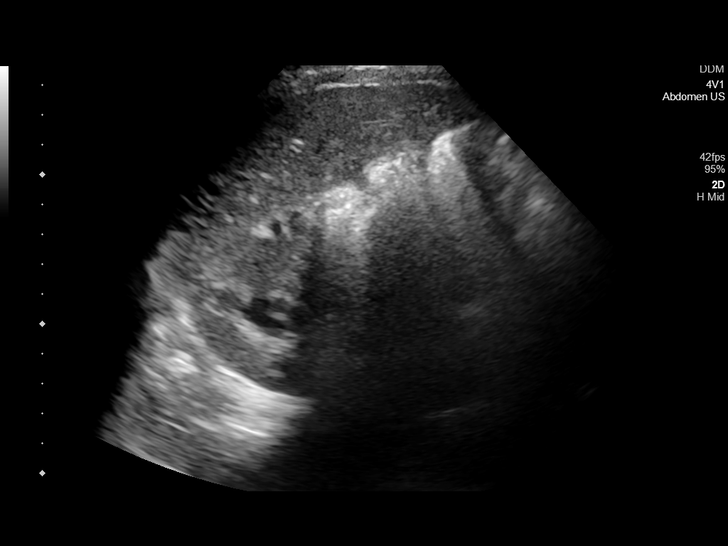
[im 37/41]
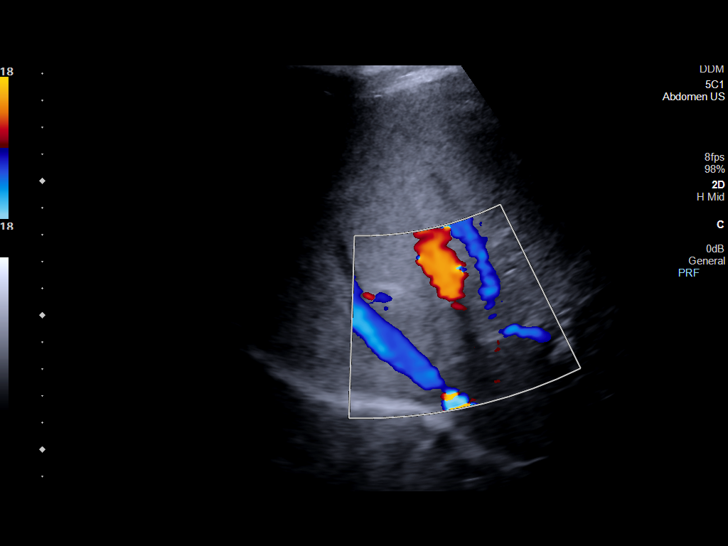
[im 41/41]
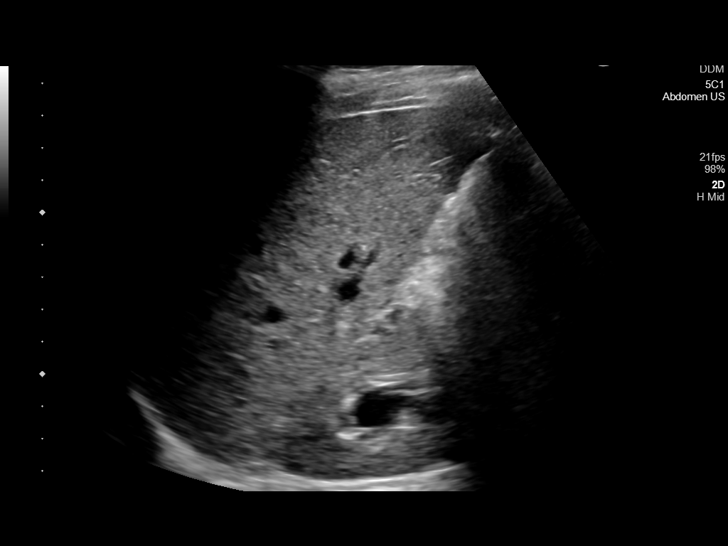

[14 of 25 positions shown; findings below may reference images not displayed]

FINDINGS: Gallbladder:

No gallstones or wall thickening visualized. No sonographic Murphy
sign noted by sonographer.

Common bile duct:

Diameter: 2 mm, normal

Liver:

No focal lesion identified. Within normal limits in parenchymal
echogenicity. Portal vein is patent on color Doppler imaging with
normal direction of blood flow towards the liver.

Other: Right hydronephrosis.
IMPRESSION: Unremarkable sonographic appearance of the liver and gallbladder.

Right hydronephrosis probably related to pregnancy.

## 2021-09-30 ENCOUNTER — Encounter: Payer: Self-pay | Admitting: Oncology

## 2021-10-01 ENCOUNTER — Other Ambulatory Visit: Payer: Self-pay | Admitting: *Deleted

## 2021-10-01 DIAGNOSIS — O99013 Anemia complicating pregnancy, third trimester: Secondary | ICD-10-CM

## 2021-10-01 DIAGNOSIS — O9902 Anemia complicating childbirth: Secondary | ICD-10-CM

## 2021-10-05 NOTE — Progress Notes (Signed)
[facility]  [phone] [phone]  ID: Kendra Ward OB: [birth year]  MR#: [id number]  KYH#:[id number]  Patient Care Team: System, Provider Not In as PCP - General  CHIEF COMPLAINT: Anemia during third trimester pregnancy.  INTERVAL HISTORY: Patient returns to clinic today 3 months after having an uneventful C-section for her twins for repeat laboratory work and further evaluation.  She currently feels well and is asymptomatic.  She has no neurologic complaints.  She denies any recent fevers or illnesses.  She has no chest pain, shortness of breath, cough or hemoptysis.  She denies any nausea, vomiting, constipation, or diarrhea.  She has no urinary complaints.  Patient offers no specific complaints today.  REVIEW OF SYSTEMS:   Review of Systems  Constitutional: Negative.  Negative for fever, malaise/fatigue and weight loss.  Respiratory: Negative.  Negative for cough, hemoptysis and shortness of breath.   Cardiovascular: Negative.  Negative for chest pain and leg swelling.  Gastrointestinal: Negative.  Negative for abdominal pain.  Genitourinary: Negative.  Negative for dysuria.  Musculoskeletal: Negative.  Negative for back pain.  Skin: Negative.  Negative for rash.  Neurological: Negative.  Negative for dizziness, focal weakness, weakness and headaches.  Psychiatric/Behavioral: Negative.  The patient is not nervous/anxious.    As per HPI. Otherwise, a complete review of systems is negative.  PAST MEDICAL HISTORY: Past Medical History:  Diagnosis Date   Headache    No known health problems    Ovarian cyst 2022   L side-ruptured    PAST SURGICAL HISTORY: Past Surgical History:  Procedure Laterality Date   CESAREAN SECTION MULTI-GESTATIONAL N/A 06/30/2021   Procedure: CESAREAN SECTION MULTI-GESTATIONAL;  Surgeon: Vena Austria, MD;  Location: [facility] ORS;  Service: Obstetrics;  Laterality: N/A;   DIAGNOSTIC LAPAROSCOPY WITH  REMOVAL OF ECTOPIC PREGNANCY N/A 11/26/2020   Procedure: DIAGNOSTIC LAPAROSCOPY;  Surgeon: Nadara Mustard, MD;  Location: [facility] ORS;  Service: Gynecology;  Laterality: N/A;    FAMILY HISTORY: Family History  Problem Relation Age of Onset   Diabetes Maternal Uncle    Diabetes Maternal Grandmother    Diabetes Maternal Grandfather     ADVANCED DIRECTIVES (Y/N):  N  HEALTH MAINTENANCE: Social History   Tobacco Use   Smoking status: Former    Types: Cigarettes    Quit date: 11/29/2020    Years since quitting: 0.8   Smokeless tobacco: Never  Vaping Use   Vaping Use: Never used  Substance Use Topics   Alcohol use: Not Currently   Drug use: Yes    Types: Marijuana    Comment: 11/29/2020     Colonoscopy:  PAP:  Bone density:  Lipid panel:  Allergies  Allergen Reactions   Latex Hives and Itching    Current Outpatient Medications  Medication Sig Dispense Refill   ferrous sulfate 325 (65 FE) MG tablet Take 325 mg by mouth daily with breakfast. (Patient not taking: Reported on 10/08/2021)     norethindrone (MICRONOR) 0.35 MG tablet Take 1 tablet (0.35 mg total) by mouth daily. (Patient not taking: Reported on 10/08/2021) 28 tablet 11   Prenatal Vit-Fe Fumarate-FA (MULTIVITAMIN-PRENATAL) 27-0.8 MG TABS tablet Take 1 tablet by mouth daily at 12 noon. (Patient not taking: Reported on 10/08/2021)     No current facility-administered medications for this visit.    OBJECTIVE: Vitals:   10/08/21 1421  BP: 113/72  Pulse: 85  Resp: 20  Temp: (!) 96.6 F (35.9 C)  SpO2: 100%     Body mass index  is 25.66 kg/m.    ECOG FS:0 - Asymptomatic  General: Well-developed, well-nourished, no acute distress. Eyes: Pink conjunctiva, anicteric sclera. HEENT: Normocephalic, moist mucous membranes. Lungs: No audible wheezing or coughing. Heart: Regular rate and rhythm. Abdomen: Soft, nontender, no obvious distention. Musculoskeletal: No edema, cyanosis, or clubbing. Neuro: Alert,  answering all questions appropriately. Cranial nerves grossly intact. Skin: No rashes or petechiae noted. Psych: Normal affect.   LAB RESULTS:  Lab Results  Component Value Date   NA 134 (L) 05/20/2021   K 3.9 05/20/2021   CL 108 05/20/2021   CO2 23 05/20/2021   GLUCOSE 83 05/20/2021   BUN 8 05/20/2021   CREATININE 0.57 05/20/2021   CALCIUM 8.4 (L) 05/20/2021   PROT 6.6 05/20/2021   ALBUMIN 2.9 (L) 05/20/2021   AST 24 05/20/2021   ALT 14 05/20/2021   ALKPHOS 98 05/20/2021   BILITOT 0.6 05/20/2021   GFRNONAA >60 05/20/2021    Lab Results  Component Value Date   WBC 4.7 10/08/2021   NEUTROABS 1.9 10/08/2021   HGB 11.7 (L) 10/08/2021   HCT 34.3 (L) 10/08/2021   MCV 87.3 10/08/2021   PLT 329 10/08/2021   Lab Results  Component Value Date   IRON 92 06/11/2021   TIBC 685 (H) 06/11/2021   IRONPCTSAT 13 06/11/2021   Lab Results  Component Value Date   FERRITIN 11 06/11/2021     STUDIES: No results found.  ASSESSMENT: Anemia during third trimester pregnancy.  PLAN:    Anemia during third trimester pregnancy: Patient's hemoglobin has significantly improved and is now nearly within normal limits at 11.7.  Iron stores and B12 levels are pending at time of dictation.  Patient last received IV Feraheme on June 26, 2021.  She last received B12 injection on June 19, 2021.  No intervention is needed at this time.  No further follow-up has been scheduled.  Please refer patient back if there are any questions or concerns.   B12 deficiency: No intervention needed.  Last B12 injection on June 19, 2021. Pregnancy: Patient underwent C-section on June 30, 2021 and gave birth to a healthy boy and girl.  I spent a total of 20 minutes reviewing chart data, face-to-face evaluation with the patient, counseling and coordination of care as detailed above.    Patient expressed understanding and was in agreement with this plan. She also understands that She can call  clinic at any time with any questions, concerns, or complaints.    Jeralyn Ruths, MD   10/08/2021 3:25 PM

## 2021-10-08 ENCOUNTER — Inpatient Hospital Stay: Payer: Medicaid Other

## 2021-10-08 ENCOUNTER — Inpatient Hospital Stay: Payer: Medicaid Other | Attending: Oncology | Admitting: Oncology

## 2021-10-08 ENCOUNTER — Other Ambulatory Visit: Payer: Self-pay

## 2021-10-08 ENCOUNTER — Encounter: Payer: Self-pay | Admitting: Oncology

## 2021-10-08 ENCOUNTER — Inpatient Hospital Stay (HOSPITAL_BASED_OUTPATIENT_CLINIC_OR_DEPARTMENT_OTHER): Payer: Medicaid Other | Admitting: Oncology

## 2021-10-08 VITALS — BP 113/72 | HR 85 | Temp 96.6°F | Resp 20 | Wt 161.4 lb

## 2021-10-08 DIAGNOSIS — Z833 Family history of diabetes mellitus: Secondary | ICD-10-CM | POA: Insufficient documentation

## 2021-10-08 DIAGNOSIS — D649 Anemia, unspecified: Secondary | ICD-10-CM | POA: Insufficient documentation

## 2021-10-08 DIAGNOSIS — F1290 Cannabis use, unspecified, uncomplicated: Secondary | ICD-10-CM | POA: Diagnosis not present

## 2021-10-08 DIAGNOSIS — O9902 Anemia complicating childbirth: Secondary | ICD-10-CM

## 2021-10-08 DIAGNOSIS — O99013 Anemia complicating pregnancy, third trimester: Secondary | ICD-10-CM

## 2021-10-08 DIAGNOSIS — Z8759 Personal history of other complications of pregnancy, childbirth and the puerperium: Secondary | ICD-10-CM | POA: Diagnosis not present

## 2021-10-08 DIAGNOSIS — Z79899 Other long term (current) drug therapy: Secondary | ICD-10-CM | POA: Diagnosis not present

## 2021-10-08 DIAGNOSIS — Z98891 History of uterine scar from previous surgery: Secondary | ICD-10-CM | POA: Diagnosis not present

## 2021-10-08 DIAGNOSIS — E538 Deficiency of other specified B group vitamins: Secondary | ICD-10-CM | POA: Diagnosis not present

## 2021-10-08 LAB — CBC WITH DIFFERENTIAL/PLATELET
Abs Immature Granulocytes: 0.01 10*3/uL (ref 0.00–0.07)
Basophils Absolute: 0 10*3/uL (ref 0.0–0.1)
Basophils Relative: 1 %
Eosinophils Absolute: 0.2 10*3/uL (ref 0.0–0.5)
Eosinophils Relative: 5 %
HCT: 34.3 % — ABNORMAL LOW (ref 36.0–46.0)
Hemoglobin: 11.7 g/dL — ABNORMAL LOW (ref 12.0–15.0)
Immature Granulocytes: 0 %
Lymphocytes Relative: 44 %
Lymphs Abs: 2.1 10*3/uL (ref 0.7–4.0)
MCH: 29.8 pg (ref 26.0–34.0)
MCHC: 34.1 g/dL (ref 30.0–36.0)
MCV: 87.3 fL (ref 80.0–100.0)
Monocytes Absolute: 0.5 10*3/uL (ref 0.1–1.0)
Monocytes Relative: 10 %
Neutro Abs: 1.9 10*3/uL (ref 1.7–7.7)
Neutrophils Relative %: 40 %
Platelets: 329 10*3/uL (ref 150–400)
RBC: 3.93 MIL/uL (ref 3.87–5.11)
RDW: 14.4 % (ref 11.5–15.5)
WBC: 4.7 10*3/uL (ref 4.0–10.5)
nRBC: 0 % (ref 0.0–0.2)

## 2021-10-08 LAB — VITAMIN B12: Vitamin B-12: 217 pg/mL (ref 180–914)

## 2021-10-08 LAB — IRON AND TIBC
Iron: 104 ug/dL (ref 28–170)
Saturation Ratios: 34 % — ABNORMAL HIGH (ref 10.4–31.8)
TIBC: 309 ug/dL (ref 250–450)
UIBC: 205 ug/dL

## 2021-10-08 LAB — FERRITIN: Ferritin: 93 ng/mL (ref 11–307)

## 2021-10-08 NOTE — Progress Notes (Signed)
Patient states for the last month she has not had an appetite.

## 2021-10-14 IMAGING — US US MFM OB FOLLOW-UP
1 series · 12 of 28 positions shown · non-contrast
Comparison: none

[Series 1: us mfm ob follow-up · 64 acquisitions, 12 frames shown]
[im 3/64]
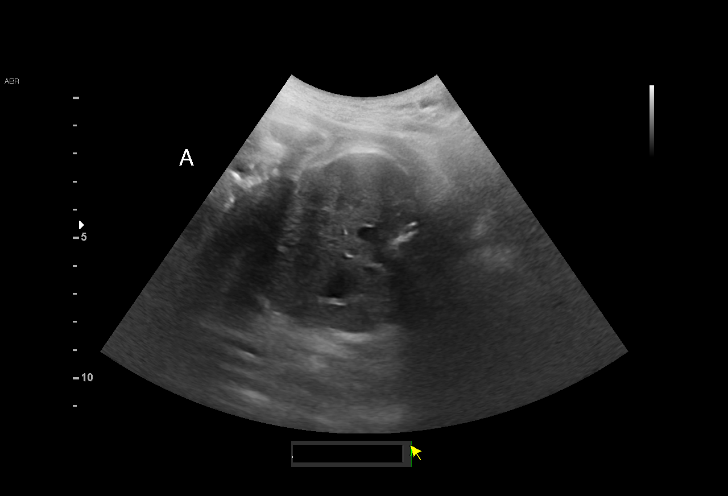
[im 8/64]
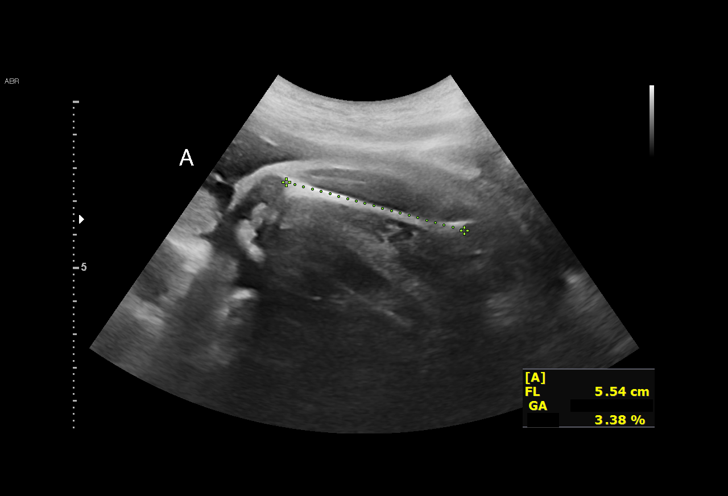
[im 12/64]
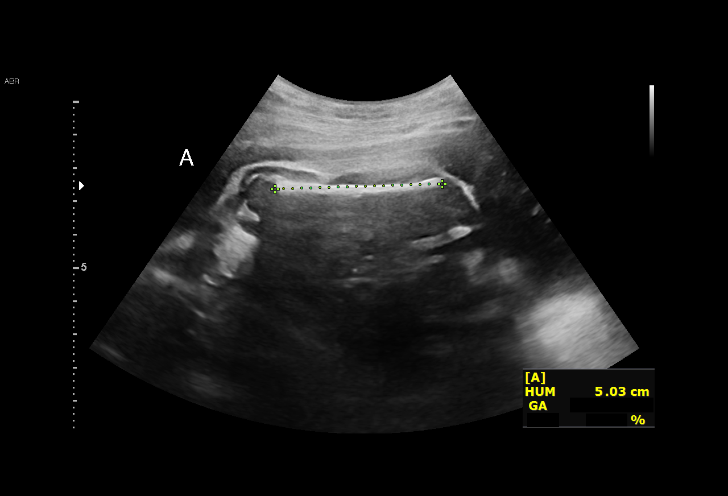
[im 19/64]
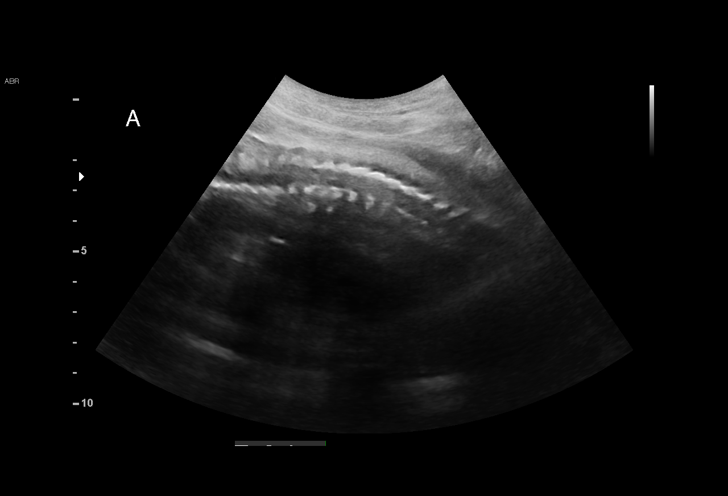
[im 24/64]
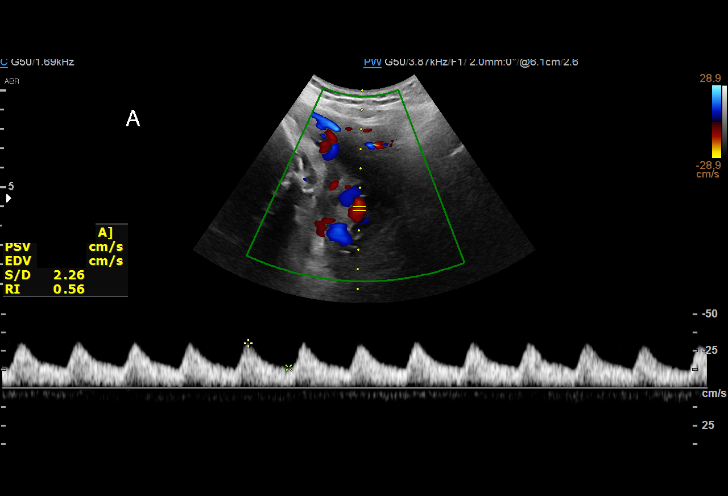
[im 29/64]
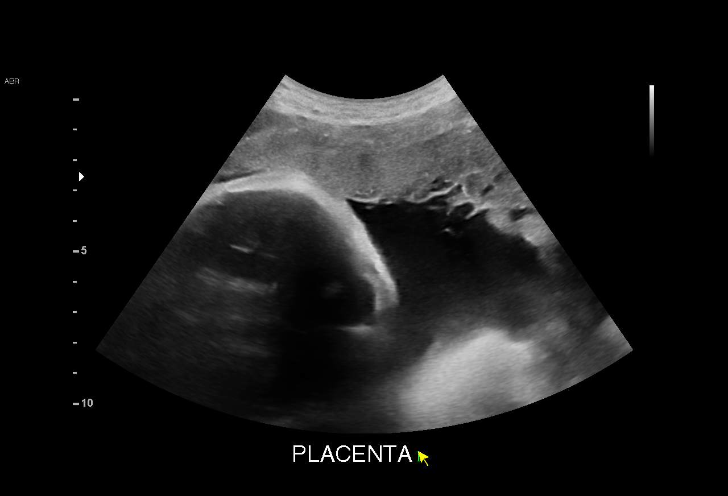
[im 36/64]
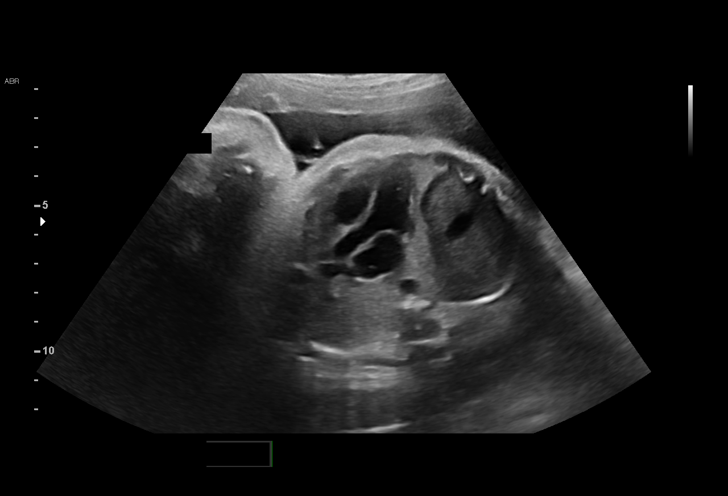
[im 40/64]
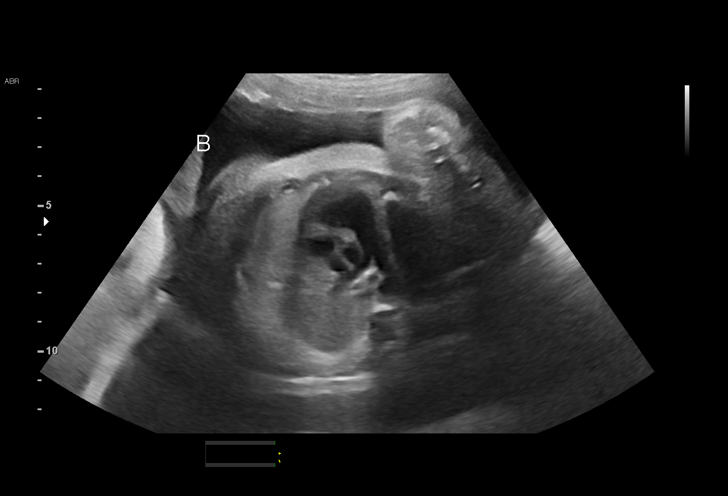
[im 45/64]
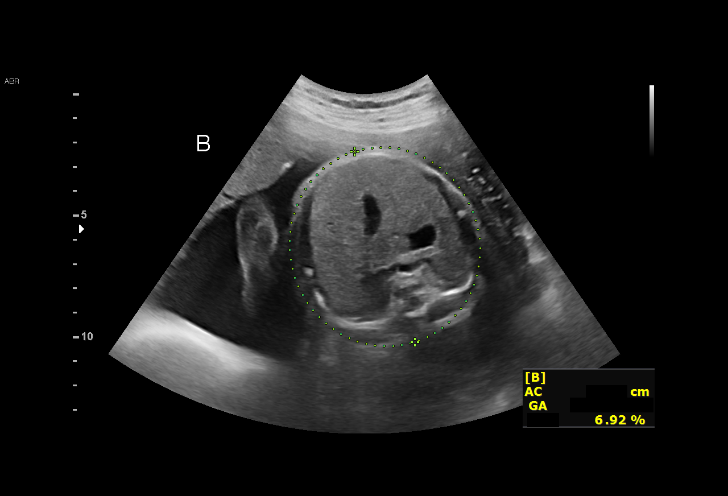
[im 52/64]
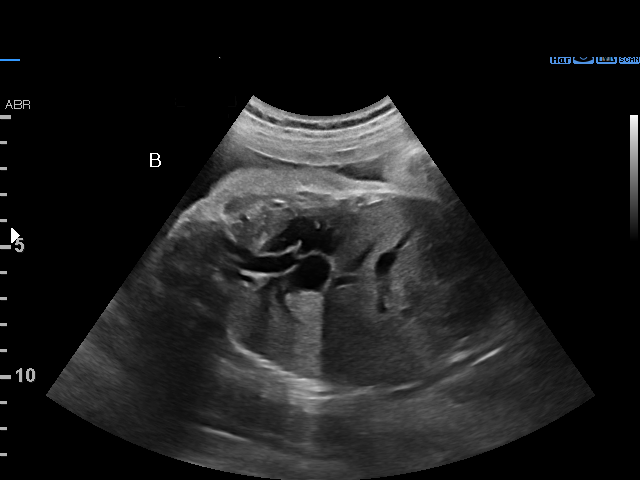
[im 57/64]
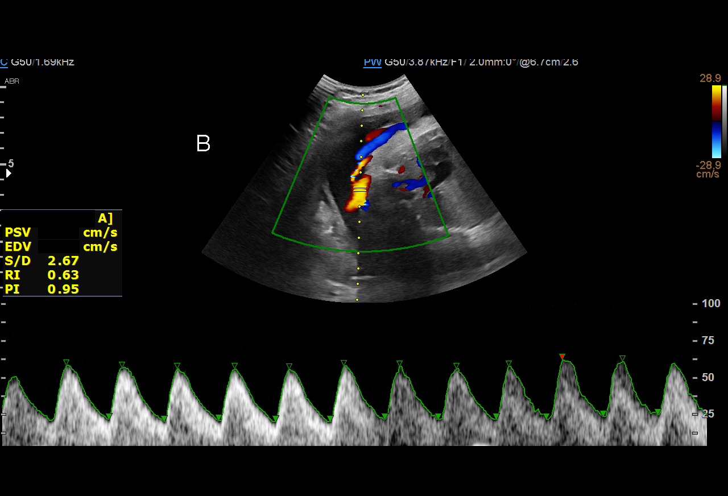
[im 61/64]
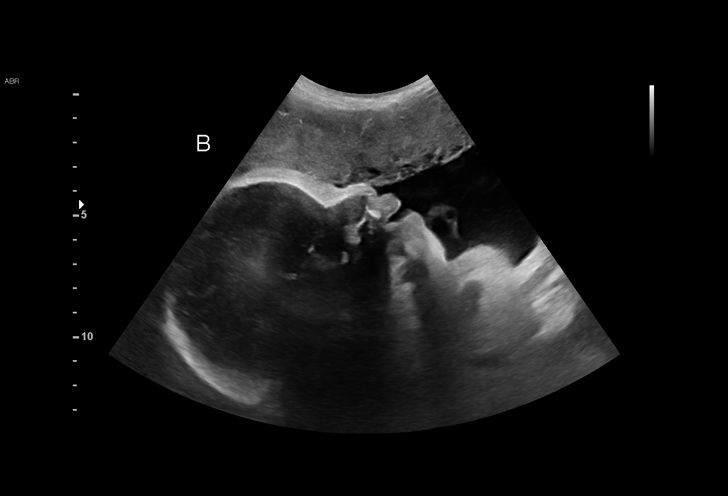

[12 of 28 positions shown; findings below may reference images not displayed]

GEST
    ADDL GESTATION

Indications

 31 weeks gestation of pregnancy
 Twin pregnancy, di/di, third trimester
Fetal Evaluation (Fetus A)

 Num Of Fetuses:         2
 Fetal Heart Rate(bpm):  132
 Cardiac Activity:       Observed
 Presentation:           Breech
 Placenta:               Anterior
 P. Cord Insertion:      Previously Visualized

                             Largest Pocket(cm)

Biophysical Evaluation (Fetus A)
 Amniotic F.V:   Within normal limits       F. Tone:        Observed
 F. Movement:    Observed                   Score:          [DATE]
 F. Breathing:   Observed
Biometry (Fetus A)

 BPD:      80.5  mm     G. Age:  32w 2d         75  %    CI:        75.22   %    70 - 86
                                                         FL/HC:      18.5   %    19.3 -
 HC:      294.4  mm     G. Age:  32w 4d         51  %    HC/AC:      1.28        0.96 -
 AC:      230.2  mm     G. Age:  27w 3d        < 1  %    FL/BPD:     67.7   %    71 - 87
 FL:       54.5  mm     G. Age:  28w 6d        1.6  %    FL/AC:      23.7   %    20 - 24
 HUM:      50.4  mm     G. Age:  29w 4d         18  %
 LV:          4  mm

 Est. FW:    7393  gm    2 lb 13 oz     1.1  %     FW Discordancy        21  %
Gestational Age (Fetus A)

 LMP:           31w 1d        Date:  10/29/20                 EDD:   08/05/21
 U/S Today:     30w 2d                                        EDD:   08/11/21
 Best:          31w 1d     Det. By:  LMP  (10/29/20)          EDD:   08/05/21
Anatomy (Fetus A)

 Cranium:               Appears normal         Aortic Arch:            Previously seen
 Cavum:                 Previously seen        Ductal Arch:            Previously seen
 Ventricles:            Appears normal         Diaphragm:              Appears normal
 Choroid Plexus:        Previously seen        Stomach:                Appears normal, left
                                                                       sided
 Cerebellum:            Previously seen        Abdomen:                Previously seen
 Posterior Fossa:       Previously seen        Abdominal Wall:         Previously seen
 Nuchal Fold:           Previously seen        Cord Vessels:           Previously seen
 Face:                  Orbits and profile     Kidneys:                Previously seen
                        previously seen
 Lips:                  Previously seen        Bladder:                Appears normal
 Heart:                 Appears normal         Spine:                  Previously seen
                        (4CH, axis, and
                        situs)
 RVOT:                  Appears normal         Upper Extremities:      Previously seen
 LVOT:                  Previously seen        Lower Extremities:      Previously seen
Doppler - Fetal Vessels (Fetus A)

 Umbilical Artery
  S/D     %tile      RI    %tile
  2.83       55    0.65       61

Fetal Evaluation (Fetus B)

 Num Of Fetuses:         2
 Fetal Heart Rate(bpm):  147
 Cardiac Activity:       Observed
 Presentation:           Transverse, head to maternal right
 Placenta:               Anterior
 P. Cord Insertion:      Previously Visualized
                             Largest Pocket(cm)
                             4
Biophysical Evaluation (Fetus B)

 Amniotic F.V:   Within normal limits       F. Tone:        Observed
 F. Movement:    Observed                   Score:          [DATE]
 F. Breathing:   Observed
Biometry (Fetus B)

 BPD:      81.4  mm     G. Age:  32w 5d         84  %    CI:        73.53   %    70 - 86
                                                         FL/HC:      19.4   %    19.3 -
 HC:      301.6  mm     G. Age:  33w 3d         77  %    HC/AC:      1.18        0.96 -
 AC:      255.8  mm     G. Age:  29w 5d         12  %    FL/BPD:     72.0   %    71 - 87
 FL:       58.6  mm     G. Age:  30w 4d         22  %    FL/AC:      22.9   %    20 - 24
 HUM:        54  mm     G. Age:  31w 3d         58  %

 Est. FW:    7785  gm      3 lb 9 oz     22  %     FW Discordancy     0 \ 21 %
Gestational Age (Fetus B)

 LMP:           31w 1d        Date:  10/29/20                 EDD:   08/05/21
 U/S Today:     31w 4d                                        EDD:   08/02/21
 Best:          31w 1d     Det. By:  LMP  (10/29/20)          EDD:   08/05/21
Anatomy (Fetus B)

 Cranium:               Appears normal         Aortic Arch:            Previously seen
 Cavum:                 Previously seen        Ductal Arch:            Previously seen
 Ventricles:            Appears normal         Diaphragm:              Appears normal
 Choroid Plexus:        Previously seen        Stomach:                Appears normal, left
                                                                       sided
 Cerebellum:            Previously seen        Abdomen:                Previously seen
 Posterior Fossa:       Previously seen        Abdominal Wall:         Previously seen
 Nuchal Fold:           Previously seen        Cord Vessels:           Previously seen
 Face:                  Orbits and profile     Kidneys:                Previously seen
                        previously seen
 Lips:                  Previously seen        Bladder:                Appears normal
 Heart:                 Appears normal         Spine:                  Previously seen
                        (4CH, axis, and
                        situs)
 RVOT:                  Appears normal         Upper Extremities:      Previously seen
 LVOT:                  Appears normal         Lower Extremities:      Previously seen
Doppler - Fetal Vessels (Fetus B)

 Umbilical Artery
  S/D     %tile      RI    %tile                             ADFV    RDFV
  2.67       46    0.63       51                                No      No

Impression

 Follow up growth for dichorionic diamnoitic twin pregnancy
 with FGR of TwIn A
 Twin A normal stomach, amniotic fluid, bladder and BPP [DATE]-
 EFW 1.1% Breech presentation.
 Twin B normal stomach, amniotic fluid, bladder and BPP [DATE]-
 EFW 22% head maternal right, transverse
 UA Dopplers are normal in both twin A and B, without
 evidence of AEDF or REDF.

 Twin discordance is 21%.

 I reviewed today's exam and discuss the decreasing growth
 in Twin A.  Ms. Dyanko not good fetal movement.

 Given the EFW of 1.1% I recommend continued weekly
 testing with plan for delivery during the 36th week.
Recommendations

 Continue weekly testing
 Repeat growth in 3 weeks.

## 2021-10-22 ENCOUNTER — Other Ambulatory Visit: Payer: Medicaid Other

## 2021-11-05 ENCOUNTER — Ambulatory Visit: Payer: Medicaid Other | Admitting: Oncology

## 2021-11-05 ENCOUNTER — Other Ambulatory Visit: Payer: Medicaid Other

## 2021-12-03 ENCOUNTER — Other Ambulatory Visit: Payer: Self-pay

## 2021-12-03 ENCOUNTER — Ambulatory Visit
Admission: EM | Admit: 2021-12-03 | Discharge: 2021-12-03 | Disposition: A | Discharge status: Home / Self Care | Payer: Medicaid Other | Attending: Internal Medicine | Admitting: Internal Medicine

## 2021-12-03 ENCOUNTER — Encounter: Payer: Self-pay | Admitting: Internal Medicine

## 2021-12-03 DIAGNOSIS — M940 Chondrocostal junction syndrome [Tietze]: Secondary | ICD-10-CM

## 2021-12-03 MED ORDER — MELOXICAM 7.5 MG PO TABS: 7.5000 mg | ORAL_TABLET | Freq: Two times a day (BID) | ORAL | 0 refills | Status: DC

## 2021-12-03 NOTE — ED Provider Notes (Addendum)
[facility]    CSN: [id number] Arrival date & time: 12/03/21  1458      History   Chief Complaint Chief Complaint  Patient presents with   Chest Pain    HPI Kendra Ward is a 26 y.o. female who presents with onset of L chest pain x 3 days, but was intermittent and now since today is constant. Pain is described as sharp. Worse with any movement above her waist line. She denies a cough, has a mild ST for the past few days. Denies fever.     Past Medical History:  Diagnosis Date   Headache    No known health problems    Ovarian cyst 2022   L side-ruptured    Patient Active Problem List   Diagnosis Date Noted   Oligohydramnios in third trimester    Encounter for care or examination of lactating mother 07/03/2021   [redacted] weeks gestation of pregnancy 07/03/2021   Postpartum care following cesarean delivery 07/03/2021   IUGR (intrauterine growth restriction) affecting care of mother, third trimester, fetus 1 06/30/2021   Decreased fetal movement 06/14/2021   History of depression 06/11/2021   Anemia in pregnancy, third trimester 05/20/2021   Abdominal pain in pregnancy, third trimester    Intrauterine growth restriction (IUGR) affecting care of mother 05/15/2021   Supervision of high risk pregnancy, antepartum 12/24/2020   Twin gestation in third trimester 12/08/2020   LLQ pain    Left ovarian cyst     Past Surgical History:  Procedure Laterality Date   CESAREAN SECTION MULTI-GESTATIONAL N/A 06/30/2021   Procedure: CESAREAN SECTION MULTI-GESTATIONAL;  Surgeon: Malachy Mood, MD;  Location: [facility];  Service: Obstetrics;  Laterality: N/A;   DIAGNOSTIC LAPAROSCOPY WITH REMOVAL OF ECTOPIC PREGNANCY N/A 11/26/2020   Procedure: DIAGNOSTIC LAPAROSCOPY;  Surgeon: Gae Dry, MD;  Location: [facility];  Service: Gynecology;  Laterality: N/A;    OB History     Gravida  2   Para  1   Term      Preterm  1   AB  1   Living  2      SAB  1   IAB       Ectopic      Multiple  1   Live Births  2            Home Medications    Prior to Admission medications   Medication Sig Start Date End Date Taking? Authorizing Provider  meloxicam (MOBIC) 7.5 MG tablet Take 1 tablet (7.5 mg total) by mouth 2 (two) times daily. 12/03/21  Yes Rodriguez-Southworth, Sunday Spillers, PA-C    Family History Family History  Problem Relation Age of Onset   Diabetes Maternal Uncle    Diabetes Maternal Grandmother    Diabetes Maternal Grandfather     Social History Social History   Tobacco Use   Smoking status: Former    Types: Cigarettes    Quit date: 11/29/2020    Years since quitting: 1.0   Smokeless tobacco: Never  Vaping Use   Vaping Use: Never used  Substance Use Topics   Alcohol use: Not Currently   Drug use: Yes    Types: Marijuana    Comment: 11/29/2020     Allergies   Latex   Review of Systems Review of Systems  HENT:  Positive for sore throat. Negative for congestion.   Respiratory:  Negative for cough.        Chest wall tenderness  Musculoskeletal:  Negative for  gait problem and myalgias.  Neurological:  Negative for headaches.    Physical Exam Triage Vital Signs ED Triage Vitals [12/03/21 1515]  Enc Vitals Group     BP 112/75     Pulse Rate 76     Resp 16     Temp 98.8 F (37.1 C)     Temp Source Oral     SpO2 97 %     Weight      Height      Head Circumference      Peak Flow      Pain Score      Pain Loc      Pain Edu?      Excl. in [place]?    No data found.  Updated Vital Signs BP 112/75 (BP Location: Left Arm)    Pulse 76    Temp 98.8 F (37.1 C) (Oral)    Resp 16    SpO2 97%   Visual Acuity Right Eye Distance:   Left Eye Distance:   Bilateral Distance:    Right Eye Near:   Left Eye Near:    Bilateral Near:     Physical Exam Vitals and nursing note reviewed.  Constitutional:      General: She is not in acute distress.    Appearance: She is not toxic-appearing.  HENT:     Head:  Normocephalic.     Right Ear: External ear normal.     Left Ear: External ear normal.     Nose: Nose normal.     Mouth/Throat:     Mouth: Mucous membranes are moist.     Pharynx: Oropharynx is clear.  Eyes:     General: No scleral icterus.    Conjunctiva/sclera: Conjunctivae normal.  Cardiovascular:     Rate and Rhythm: Normal rate and regular rhythm.     Heart sounds: No murmur heard. Pulmonary:     Effort: Pulmonary effort is normal.     Breath sounds: Normal breath sounds.     Comments: L chest wall tenderness on mid area reproduced her pain Chest:     Chest wall: Tenderness present.  Musculoskeletal:     Cervical back: Neck supple.  Skin:    General: Skin is warm and dry.     Findings: No rash.  Neurological:     Mental Status: She is alert and oriented to person, place, and time.     Gait: Gait normal.  Psychiatric:        Mood and Affect: Mood normal.        Behavior: Behavior normal.        Thought Content: Thought content normal.        Judgment: Judgment normal.     UC Treatments / Results  Labs (all labs ordered are listed, but only abnormal results are displayed) Labs Reviewed - No data to display  EKG   Radiology No results found.  Procedures Procedures (including critical care time)  Medications Ordered in UC Medications - No data to display  Initial Impression / Assessment and Plan / UC Course  I have reviewed the triage vital signs and the nursing notes. Costochondritis She was placed on Mobic as needed.  Final Clinical Impressions(s) / UC Diagnoses   Final diagnoses:  Costochondritis   Discharge Instructions   None    ED Prescriptions     Medication Sig Dispense Auth. Provider   meloxicam (MOBIC) 7.5 MG tablet Take 1 tablet (7.5 mg total) by mouth 2 (two)  times daily. 14 tablet Rodriguez-Southworth, Sunday Spillers, PA-C      PDMP not reviewed this encounter.   Shelby Mattocks, PA-C 12/03/21 1828     Shelby Mattocks, PA-C 12/03/21 [phone]

## 2021-12-03 NOTE — ED Triage Notes (Signed)
Provider triage  

## 2022-07-28 ENCOUNTER — Ambulatory Visit
Admission: EM | Admit: 2022-07-28 | Discharge: 2022-07-28 | Disposition: A | Discharge status: Home / Self Care | Payer: Medicaid Other | Attending: Physician Assistant | Admitting: Physician Assistant

## 2022-07-28 DIAGNOSIS — R519 Headache, unspecified: Secondary | ICD-10-CM | POA: Insufficient documentation

## 2022-07-28 DIAGNOSIS — Z20822 Contact with and (suspected) exposure to covid-19: Secondary | ICD-10-CM | POA: Insufficient documentation

## 2022-07-28 DIAGNOSIS — U071 COVID-19: Secondary | ICD-10-CM | POA: Diagnosis not present

## 2022-07-28 DIAGNOSIS — R051 Acute cough: Secondary | ICD-10-CM | POA: Insufficient documentation

## 2022-07-28 LAB — SARS CORONAVIRUS 2 BY RT PCR: SARS Coronavirus 2 by RT PCR: POSITIVE — AB

## 2022-07-28 MED ORDER — PROMETHAZINE-DM 6.25-15 MG/5ML PO SYRP: 5.0000 mL | ORAL_SOLUTION | Freq: Four times a day (QID) | ORAL | 0 refills | Status: DC | PRN

## 2022-07-28 NOTE — ED Triage Notes (Addendum)
Pt c/o headache since Saturday,  head feels "heavy", pt also congested. Pt states headache sometimes behind eyes or around nose. Pt did take a home covid test last week (unsure of day) but negative result.

## 2022-07-28 NOTE — ED Provider Notes (Signed)
[facility]    CSN: [id number] Arrival date & time: 07/28/22  1019      History   Chief Complaint Chief Complaint  Patient presents with   Nasal Congestion   Headache    HPI Kendra Ward is a 26 y.o. female presenting for 3-day history of headaches, sinus pressure, nasal congestion and sore throat.  Also reports some fatigue.  Denies fever, cough, breathing difficulty, vomiting or diarrhea.  Reports that she has 2 children that are both sick with runny nose.  Also reports that her mother was recently diagnosed with COVID and she has been around her mother.  Patient took a COVID test last week when she was asymptomatic and had a negative COVID test.  She has not taken a test since.  She has been taking over-the-counter Mucinex and Tylenol Extra Strength for symptoms.  No other concerns.  HPI  Past Medical History:  Diagnosis Date   Headache    No known health problems    Ovarian cyst 2022   L side-ruptured    Patient Active Problem List   Diagnosis Date Noted   Oligohydramnios in third trimester    Encounter for care or examination of lactating mother 07/03/2021   [redacted] weeks gestation of pregnancy 07/03/2021   Postpartum care following cesarean delivery 07/03/2021   IUGR (intrauterine growth restriction) affecting care of mother, third trimester, fetus 1 06/30/2021   Decreased fetal movement 06/14/2021   History of depression 06/11/2021   Anemia in pregnancy, third trimester 05/20/2021   Abdominal pain in pregnancy, third trimester    Intrauterine growth restriction (IUGR) affecting care of mother 05/15/2021   Supervision of high risk pregnancy, antepartum 12/24/2020   Twin gestation in third trimester 12/08/2020   LLQ pain    Left ovarian cyst     Past Surgical History:  Procedure Laterality Date   CESAREAN SECTION MULTI-GESTATIONAL N/A 06/30/2021   Procedure: CESAREAN SECTION MULTI-GESTATIONAL;  Surgeon: Vena Austria, MD;  Location: [facility];   Service: Obstetrics;  Laterality: N/A;   DIAGNOSTIC LAPAROSCOPY WITH REMOVAL OF ECTOPIC PREGNANCY N/A 11/26/2020   Procedure: DIAGNOSTIC LAPAROSCOPY;  Surgeon: Nadara Mustard, MD;  Location: [facility];  Service: Gynecology;  Laterality: N/A;    OB History     Gravida  2   Para  1   Term      Preterm  1   AB  1   Living  2      SAB  1   IAB      Ectopic      Multiple  1   Live Births  2            Home Medications    Prior to Admission medications   Medication Sig Start Date End Date Taking? Authorizing Provider  promethazine-dextromethorphan (PROMETHAZINE-DM) 6.25-15 MG/5ML syrup Take 5 mLs by mouth 4 (four) times daily as needed. 07/28/22  Yes Shirlee Latch, PA-C    Family History Family History  Problem Relation Age of Onset   Diabetes Maternal Uncle    Diabetes Maternal Grandmother    Diabetes Maternal Grandfather     Social History Social History   Tobacco Use   Smoking status: Former    Types: Cigarettes    Quit date: 11/29/2020    Years since quitting: 1.6   Smokeless tobacco: Never  Vaping Use   Vaping Use: Never used  Substance Use Topics   Alcohol use: Not Currently   Drug use: Yes  Types: Marijuana    Comment: 11/29/2020     Allergies   Latex   Review of Systems Review of Systems  Constitutional:  Positive for fatigue. Negative for chills, diaphoresis and fever.  HENT:  Positive for congestion, rhinorrhea, sinus pressure and sore throat. Negative for ear pain and sinus pain.   Respiratory:  Negative for cough and shortness of breath.   Gastrointestinal:  Negative for abdominal pain, nausea and vomiting.  Musculoskeletal:  Negative for arthralgias and myalgias.  Skin:  Negative for rash.  Neurological:  Positive for headaches. Negative for weakness.  Hematological:  Negative for adenopathy.     Physical Exam Triage Vital Signs ED Triage Vitals  Enc Vitals Group     BP      Pulse      Resp      Temp      Temp  src      SpO2      Weight      Height      Head Circumference      Peak Flow      Pain Score      Pain Loc      Pain Edu?      Excl. in [place]?    No data found.  Updated Vital Signs BP 137/81 (BP Location: Right Arm)   Pulse (!) 101   Temp 98.4 F (36.9 C) (Oral)   Ht 5' 6.5" (1.689 m)   Wt 145 lb (65.8 kg)   LMP 06/28/2022 (Approximate)   SpO2 98%   Breastfeeding No   BMI 23.05 kg/m     Physical Exam Vitals and nursing note reviewed.  Constitutional:      General: She is not in acute distress.    Appearance: Normal appearance. She is ill-appearing. She is not toxic-appearing.  HENT:     Head: Normocephalic and atraumatic.     Nose: Congestion present.     Mouth/Throat:     Mouth: Mucous membranes are moist.     Pharynx: Oropharynx is clear. Posterior oropharyngeal erythema present.  Eyes:     General: No scleral icterus.       Right eye: No discharge.        Left eye: No discharge.     Conjunctiva/sclera: Conjunctivae normal.  Cardiovascular:     Rate and Rhythm: Regular rhythm. Tachycardia present.     Heart sounds: Normal heart sounds.  Pulmonary:     Effort: Pulmonary effort is normal. No respiratory distress.     Breath sounds: Normal breath sounds.  Musculoskeletal:     Cervical back: Neck supple.  Skin:    General: Skin is dry.  Neurological:     General: No focal deficit present.     Mental Status: She is alert. Mental status is at baseline.     Motor: No weakness.     Gait: Gait normal.  Psychiatric:        Mood and Affect: Mood normal.        Behavior: Behavior normal.        Thought Content: Thought content normal.      UC Treatments / Results  Labs (all labs ordered are listed, but only abnormal results are displayed) Labs Reviewed  SARS CORONAVIRUS 2 BY RT PCR - Abnormal; Notable for the following components:      Result Value   SARS Coronavirus 2 by RT PCR POSITIVE (*)    All other components within normal limits  EKG   Radiology No results found.  Procedures Procedures (including critical care time)  Medications Ordered in UC Medications - No data to display  Initial Impression / Assessment and Plan / UC Course  I have reviewed the triage vital signs and the nursing notes.  Pertinent labs & imaging results that were available during my care of the patient were reviewed by me and considered in my medical decision making (see chart for details).   26 year old female presenting for headaches, sinus congestion/pressure, nasal congestion, fatigue x3 days.  Has been exposed to her mother who has COVID-19.  Patient took a test and she was asymptomatic and was negative.  Vitals are stable.  She is ill-appearing, lying on exam bed.  She is nontoxic.  She does have nasal congestion and mild posterior pharyngeal erythema.  Chest is clear auscultation.  PCR COVID test performed.  I did offer patient a ketorolac injection for her headache but she declines.  Positive COVID test.  Reviewed current CDC guidelines, isolation protocol and ED precautions with patient.  Sent Promethazine DM to pharmacy.  Encourage plenty of rest and fluids.  Tylenol Motrin as needed for headache.  Reviewed return precautions.  School note.   Final Clinical Impressions(s) / UC Diagnoses   Final diagnoses:  COVID-19  Acute nonintractable headache, unspecified headache type  Acute cough  Exposure to COVID-19 virus     Discharge Instructions      -Your COVID test was positive.  I have sent cough medicine to pharmacy.  You need to isolate 5 days and wear a mask for 5 days.  You need to isolate till Friday and then wear your mask for 5 days after that. -Increase rest and fluids.  Continue ibuprofen and/or Tylenol as needed for headaches.  You should be feeling better in the next week or so. - Go to ER for any breathing difficulty or weakness.  Return here as needed.     ED Prescriptions     Medication Sig  Dispense Auth. Provider   promethazine-dextromethorphan (PROMETHAZINE-DM) 6.25-15 MG/5ML syrup Take 5 mLs by mouth 4 (four) times daily as needed. 118 mL Shirlee Latch, PA-C      PDMP not reviewed this encounter.   Shirlee Latch, PA-C 07/28/22 1148

## 2022-07-28 NOTE — Discharge Instructions (Addendum)
-  Your COVID test was positive.  I have sent cough medicine to pharmacy.  You need to isolate 5 days and wear a mask for 5 days.  You need to isolate till Friday and then wear your mask for 5 days after that. -Increase rest and fluids.  Continue ibuprofen and/or Tylenol as needed for headaches.  You should be feeling better in the next week or so. - Go to ER for any breathing difficulty or weakness.  Return here as needed.

## 2023-02-07 ENCOUNTER — Encounter: Payer: Self-pay | Admitting: Oncology

## 2023-02-07 ENCOUNTER — Emergency Department
Admission: EM | Admit: 2023-02-07 | Discharge: 2023-02-07 | Discharge status: Against Medical Advice | Payer: Medicaid Other | Source: Home / Self Care

## 2023-02-07 ENCOUNTER — Other Ambulatory Visit: Payer: Self-pay

## 2023-02-07 ENCOUNTER — Emergency Department: Payer: Medicaid Other

## 2023-02-07 ENCOUNTER — Emergency Department
Admission: EM | Admit: 2023-02-07 | Discharge: 2023-02-07 | Disposition: A | Discharge status: Home / Self Care | Payer: Medicaid Other | Attending: Emergency Medicine | Admitting: Emergency Medicine

## 2023-02-07 DIAGNOSIS — Z8616 Personal history of COVID-19: Secondary | ICD-10-CM | POA: Diagnosis not present

## 2023-02-07 DIAGNOSIS — R059 Cough, unspecified: Secondary | ICD-10-CM | POA: Insufficient documentation

## 2023-02-07 DIAGNOSIS — J209 Acute bronchitis, unspecified: Secondary | ICD-10-CM | POA: Insufficient documentation

## 2023-02-07 DIAGNOSIS — R079 Chest pain, unspecified: Secondary | ICD-10-CM | POA: Diagnosis not present

## 2023-02-07 DIAGNOSIS — Z5321 Procedure and treatment not carried out due to patient leaving prior to being seen by health care provider: Secondary | ICD-10-CM | POA: Insufficient documentation

## 2023-02-07 DIAGNOSIS — R0600 Dyspnea, unspecified: Secondary | ICD-10-CM | POA: Insufficient documentation

## 2023-02-07 DIAGNOSIS — J029 Acute pharyngitis, unspecified: Secondary | ICD-10-CM | POA: Diagnosis not present

## 2023-02-07 DIAGNOSIS — Z1152 Encounter for screening for COVID-19: Secondary | ICD-10-CM | POA: Insufficient documentation

## 2023-02-07 LAB — COMPREHENSIVE METABOLIC PANEL
ALT: 13 U/L (ref 0–44)
AST: 21 U/L (ref 15–41)
Albumin: 4.5 g/dL (ref 3.5–5.0)
Alkaline Phosphatase: 42 U/L (ref 38–126)
Anion gap: 7 (ref 5–15)
BUN: 11 mg/dL (ref 6–20)
CO2: 25 mmol/L (ref 22–32)
Calcium: 9 mg/dL (ref 8.9–10.3)
Chloride: 107 mmol/L (ref 98–111)
Creatinine, Ser: 0.6 mg/dL (ref 0.44–1.00)
GFR, Estimated: >60 mL/min (ref >60)
Glucose, Bld: 118 mg/dL — ABNORMAL HIGH (ref 70–99)
Potassium: 3.4 mmol/L — ABNORMAL LOW (ref 3.5–5.1)
Sodium: 139 mmol/L (ref 135–145)
Total Bilirubin: 0.6 mg/dL (ref 0.3–1.2)
Total Protein: 7.5 g/dL (ref 6.5–8.1)

## 2023-02-07 LAB — SARS CORONAVIRUS 2 BY RT PCR
SARS Coronavirus 2 by RT PCR: NEGATIVE
SARS Coronavirus 2 by RT PCR: NEGATIVE

## 2023-02-07 LAB — CBC WITH DIFFERENTIAL/PLATELET
Abs Immature Granulocytes: 0.01 10*3/uL (ref 0.00–0.07)
Basophils Absolute: 0 10*3/uL (ref 0.0–0.1)
Basophils Relative: 0 %
Eosinophils Absolute: 0.1 10*3/uL (ref 0.0–0.5)
Eosinophils Relative: 1 %
HCT: 35.5 % — ABNORMAL LOW (ref 36.0–46.0)
Hemoglobin: 11.9 g/dL — ABNORMAL LOW (ref 12.0–15.0)
Immature Granulocytes: 0 %
Lymphocytes Relative: 37 %
Lymphs Abs: 2.8 10*3/uL (ref 0.7–4.0)
MCH: 30.7 pg (ref 26.0–34.0)
MCHC: 33.5 g/dL (ref 30.0–36.0)
MCV: 91.5 fL (ref 80.0–100.0)
Monocytes Absolute: 0.6 10*3/uL (ref 0.1–1.0)
Monocytes Relative: 8 %
Neutro Abs: 4.2 10*3/uL (ref 1.7–7.7)
Neutrophils Relative %: 54 %
Platelets: 275 10*3/uL (ref 150–400)
RBC: 3.88 MIL/uL (ref 3.87–5.11)
RDW: 12.3 % (ref 11.5–15.5)
WBC: 7.7 10*3/uL (ref 4.0–10.5)
nRBC: 0 % (ref 0.0–0.2)

## 2023-02-07 LAB — TROPONIN I (HIGH SENSITIVITY): Troponin I (High Sensitivity): <2 ng/L (ref <18)

## 2023-02-07 LAB — GROUP A STREP BY PCR: Group A Strep by PCR: NOT DETECTED

## 2023-02-07 MED ORDER — AZITHROMYCIN 250 MG PO TABS: ORAL_TABLET | ORAL | 0 refills | Status: DC

## 2023-02-07 NOTE — ED Triage Notes (Signed)
Ambulatory to triage with c/o chest pain and cough, dyspnea today. Seen here this morning for same sx but sx have worsened. Son dx with Pneumonia today.

## 2023-02-07 NOTE — ED Provider Notes (Signed)
[facility] Provider Note    Event Date/Time   First MD Initiated Contact with Patient 02/07/23 [phone]     (approximate)   History   Sore Throat and Cough   HPI  Kendra Ward is a 27 y.o. female with history of headaches and ovarian cyst presents emergency department with sore throat, cough and congestion.  Son has the same symptoms.  No fever or chills.  Symptoms have been ongoing for about 3 days.  No vomiting or diarrhea.      Physical Exam   Triage Vital Signs: ED Triage Vitals [02/07/23 0804]  Enc Vitals Group     BP 124/75     Pulse Rate 99     Resp 18     Temp 99.2 F (37.3 C)     Temp src      SpO2 97 %     Weight 135 lb (61.2 kg)     Height 5\' 6"  (1.676 m)     Head Circumference      Peak Flow      Pain Score 6     Pain Loc      Pain Edu?      Excl. in [place]?     Most recent vital signs: Vitals:   02/07/23 0804 02/07/23 0935  BP: 124/75 (!) 120/90  Pulse: 99 93  Resp: 18 17  Temp: 99.2 F (37.3 C)   SpO2: 97% 97%     General: Awake, no distress.   CV:  Good peripheral perfusion. regular rate and  rhythm Resp:  Normal effort. Lungs cta Abd:  No distention.   Other:  Throat is mildly red, neck is supple, no lymphadenopathy   ED Results / Procedures / Treatments   Labs (all labs ordered are listed, but only abnormal results are displayed) Labs Reviewed  SARS CORONAVIRUS 2 BY RT PCR  GROUP A STREP BY PCR     EKG     RADIOLOGY     PROCEDURES:   Procedures   MEDICATIONS ORDERED IN ED: Medications - No data to display   IMPRESSION / MDM / ASSESSMENT AND PLAN / ED COURSE  I reviewed the triage vital signs and the nursing notes.                              Differential diagnosis includes, but is not limited to, COVID, influenza, RSV, acute URI, strep throat  Patient's presentation is most consistent with acute complicated illness / injury requiring diagnostic workup.   The patient appears  to be very stable.  Will await respiratory panel results along with strep test.   COVID test is reassuring  The patient's child has pneumonia so we will also cover her with a Z-Pak as she is slightly febrile.  She was given strict instructions to return emergency department worsening.  Follow-up with her regular doctor in 3 days if needed.  She is in agreement treatment plan.  Discharged stable condition.   FINAL CLINICAL IMPRESSION(S) / ED DIAGNOSES   Final diagnoses:  Acute bronchitis, unspecified organism     Rx / DC Orders   ED Discharge Orders          Ordered    azithromycin (ZITHROMAX Z-PAK) 250 MG tablet        02/07/23 9604             Note:  This document was prepared using Dragon voice  recognition software and may include unintentional dictation errors.    Faythe Ghee, PA-C 02/07/23 8295    Jene Every, MD 02/07/23 [phone]

## 2023-02-07 NOTE — ED Notes (Signed)
Pt reports chest soreness from cough; productive cough; yellow phlegm per pt; hot flashes last night. Pt in NAD.

## 2023-02-07 NOTE — ED Triage Notes (Signed)
Pt to ED for sore throat cough started Friday night. Son being seen for similar sx. Reports chest wall pain with cough

## 2023-07-09 ENCOUNTER — Encounter: Payer: Self-pay | Admitting: Oncology

## 2023-07-09 ENCOUNTER — Emergency Department
Admission: EM | Admit: 2023-07-09 | Discharge: 2023-07-09 | Disposition: A | Discharge status: Home / Self Care | Payer: MEDICAID | Attending: Emergency Medicine | Admitting: Emergency Medicine

## 2023-07-09 ENCOUNTER — Other Ambulatory Visit: Payer: Self-pay

## 2023-07-09 ENCOUNTER — Encounter: Payer: Self-pay | Admitting: Emergency Medicine

## 2023-07-09 DIAGNOSIS — J069 Acute upper respiratory infection, unspecified: Secondary | ICD-10-CM

## 2023-07-09 DIAGNOSIS — Z1152 Encounter for screening for COVID-19: Secondary | ICD-10-CM | POA: Diagnosis not present

## 2023-07-09 DIAGNOSIS — J029 Acute pharyngitis, unspecified: Secondary | ICD-10-CM | POA: Insufficient documentation

## 2023-07-09 LAB — RESP PANEL BY RT-PCR (RSV, FLU A&B, COVID)  RVPGX2
Influenza A by PCR: NEGATIVE
Influenza B by PCR: NEGATIVE
Resp Syncytial Virus by PCR: NEGATIVE
SARS Coronavirus 2 by RT PCR: NEGATIVE

## 2023-07-09 LAB — GROUP A STREP BY PCR: Group A Strep by PCR: NOT DETECTED

## 2023-07-09 MED ORDER — MAGIC MOUTHWASH W/LIDOCAINE: 5.0000 mL | Freq: Four times a day (QID) | ORAL | 0 refills | Status: DC | PRN

## 2023-07-09 NOTE — ED Provider Notes (Signed)
[facility] Provider Note    Event Date/Time   First MD Initiated Contact with Patient 07/09/23 0158     (approximate)   History   Cough and Sore Throat   HPI Kendra Ward is a 27 y.o. female who presents for evaluation of about 2 days of nasal congestion and sore throat.  She said that her kids have had a cold recently as well.  The patient has had a little bit of cough but no shortness of breath or chest pain.  No fever.  She has had some bodyaches but that was more yesterday.  Some subjective fever/chills.  She said that it hurts more when she swallows.  No swelling of which she is aware.  No change in her voice.     Physical Exam   Triage Vital Signs: ED Triage Vitals  Encounter Vitals Group     BP 07/09/23 0101 112/78     Systolic BP Percentile --      Diastolic BP Percentile --      Pulse Rate 07/09/23 0101 (!) 103     Resp 07/09/23 0101 18     Temp 07/09/23 0101 98.8 F (37.1 C)     Temp src --      SpO2 07/09/23 0101 100 %     Weight 07/09/23 0103 61.2 kg (134 lb 14.7 oz)     Height 07/09/23 0103 1.676 m (5\' 6" )     Head Circumference --      Peak Flow --      Pain Score 07/09/23 0101 6     Pain Loc --      Pain Education --      Exclude from Growth Chart --     Most recent vital signs: Vitals:   07/09/23 0101  BP: 112/78  Pulse: (!) 103  Resp: 18  Temp: 98.8 F (37.1 C)  SpO2: 100%    General: Awake, no  Obvious distress. HEENT: Some nasal congestion.  Oropharynx is erythematous but without petechiae, no swelling on either side to suggest peritonsillar abscess, no exudate.  No palpable submandibular lymphadenopathy but the patient has some generalized tenderness to palpation of her neck and throat.  However she has no tenderness with manipulation of the larynx. CV:  Good peripheral perfusion.  Borderline tachycardia initially, resolved after being in her exam room. Resp:  Normal effort. Speaking easily and comfortably,  no accessory muscle usage nor intercostal retractions.   Abd:  No distention.    ED Results / Procedures / Treatments   Labs (all labs ordered are listed, but only abnormal results are displayed) Labs Reviewed  RESP PANEL BY RT-PCR (RSV, FLU A&B, COVID)  [id number]  GROUP A STREP BY PCR     PROCEDURES:  Critical Care performed: No  Procedures    IMPRESSION / MDM / ASSESSMENT AND PLAN / ED COURSE  I reviewed the triage vital signs and the nursing notes.                              Differential diagnosis includes, but is not limited to, viral URI, strep pharyngitis or other bacterial pharyngitis, peritonsillar abscess, retropharyngeal abscess or deep neck space infection, epiglottitis.  Patient's presentation is most consistent with acute complicated illness / injury requiring diagnostic workup.  Labs/studies ordered: Group A strep PCR, respiratory viral panel PCR  Interventions/Medications given:  Medications - No data to display  (Note:  hospital course my include additional interventions and/or labs/studies not listed above.)   Patient is generally well-appearing with normal vitals other than mild tachycardia when she first arrived which resolved with resting in her room.  She is sleeping in no distress.  No evidence of peritonsillar abscess or bacterial infection in her neck or throat.  No odontogenic infection.  No hot potato voice, angioedema, nor difficulty tolerating secretions.  Reassuring exam overall.  Negative PCR tests.  I explained the findings of viral URI and wrote a prescription for Magic mouthwash with lidocaine to help with the discomfort.  Recommended close outpatient follow-up and I gave my usual and customary return precautions.         FINAL CLINICAL IMPRESSION(S) / ED DIAGNOSES   Final diagnoses:  Viral URI  Viral pharyngitis     Rx / DC Orders   ED Discharge Orders          Ordered    magic mouthwash w/lidocaine SOLN  4 times daily PRN        Note to Pharmacy: Please mix viscous lidocaine 2% with magic mouthwash solution so that the lidocaine comprises approximately 25 % of the total solution.   07/09/23 0342             Note:  This document was prepared using Dragon voice recognition software and may include unintentional dictation errors.   Loleta Rose, MD 07/09/23 [phone]

## 2023-07-09 NOTE — Discharge Instructions (Addendum)

## 2023-07-09 NOTE — ED Triage Notes (Signed)
Pt presents ambulatory to triage via POV with complaints of sore throat and nasal congestion x 2 days. Pt notes her kids having a cold recently but has been experiencing more pain with swallowing. Rates the pain 6/10; mild edema and erythema noted to her throat. A&Ox4 at this time. Denies CP or SOB.

## 2023-07-09 NOTE — ED Notes (Signed)
Patient given discharge instructions including prescriptions x1 and importance of follow up appt as needed with stated understanding. Patient stable and ambulatory with steady even gait on dispo.

## 2023-07-10 ENCOUNTER — Telehealth: Payer: Self-pay | Admitting: Emergency Medicine

## 2023-07-10 MED ORDER — MAGIC MOUTHWASH W/LIDOCAINE: 5.0000 mL | Freq: Four times a day (QID) | ORAL | 0 refills | Status: DC | PRN

## 2023-07-10 NOTE — Telephone Encounter (Signed)
Patient reportedly was unable to fill the prescription for "Magic mouthwash" with viscous lidocaine at the [facility] to which the prescription was sent electronically.  I received a note to ask me to send a prescription to a compound pharmacy.  However it is the middle of the night and I have no way to verify what pharmacy would be open this weekend nor what pharmacy in the area would be able to compound the prescription.  As a result, I printed the prescription for her and left a note with the ED charge nurse to pass along to the daytime team that the prescription will be available for the patient to pick up at the front desk.  She can call around to pharmacies convenient for her and find out which pharmacy will be able to fill the prescription.

## 2023-11-04 ENCOUNTER — Encounter: Payer: Self-pay | Admitting: Emergency Medicine

## 2023-11-04 ENCOUNTER — Ambulatory Visit
Admission: EM | Admit: 2023-11-04 | Discharge: 2023-11-04 | Disposition: A | Discharge status: Home / Self Care | Payer: MEDICAID

## 2023-11-04 DIAGNOSIS — M25512 Pain in left shoulder: Secondary | ICD-10-CM | POA: Diagnosis not present

## 2023-11-04 DIAGNOSIS — G8929 Other chronic pain: Secondary | ICD-10-CM | POA: Diagnosis not present

## 2023-11-04 NOTE — ED Provider Notes (Signed)
[facility]    CSN: [id number] Arrival date & time: 11/04/23  1228      History   Chief Complaint Chief Complaint  Patient presents with   Shoulder Pain    left    HPI Kendra Ward is a 28 y.o. female.   HPI  28 year old female with past medical history significant for headaches, ovarian cyst, and depression presents for evaluation of left shoulder pain.  She reports that approximately 2 years ago she had a shoulder injury and that the pain has been off and on for the last year and a half.  She is in the [employer] and now that she is doing push-ups she is experiencing an increase in the pain.  Patient was previously evaluated by [facility] and was doing PT with [facility].  Past Medical History:  Diagnosis Date   Headache    No known health problems    Ovarian cyst 2022   L side-ruptured    Patient Active Problem List   Diagnosis Date Noted   Oligohydramnios in third trimester    Encounter for care or examination of lactating mother 07/03/2021   [redacted] weeks gestation of pregnancy 07/03/2021   Postpartum care following cesarean delivery 07/03/2021   IUGR (intrauterine growth restriction) affecting care of mother, third trimester, fetus 1 06/30/2021   Decreased fetal movement 06/14/2021   History of depression 06/11/2021   Anemia in pregnancy, third trimester 05/20/2021   Abdominal pain in pregnancy, third trimester    Intrauterine growth restriction (IUGR) affecting care of mother 05/15/2021   Supervision of high risk pregnancy, antepartum 12/24/2020   Twin gestation in third trimester 12/08/2020   LLQ pain    Left ovarian cyst     Past Surgical History:  Procedure Laterality Date   CESAREAN SECTION MULTI-GESTATIONAL N/A 06/30/2021   Procedure: CESAREAN SECTION MULTI-GESTATIONAL;  Surgeon: Vena Austria, MD;  Location: [facility] ORS;  Service: Obstetrics;  Laterality: N/A;   DIAGNOSTIC LAPAROSCOPY WITH REMOVAL OF ECTOPIC PREGNANCY N/A 11/26/2020    Procedure: DIAGNOSTIC LAPAROSCOPY;  Surgeon: Nadara Mustard, MD;  Location: [facility] ORS;  Service: Gynecology;  Laterality: N/A;    OB History     Gravida  2   Para  1   Term      Preterm  1   AB  1   Living  2      SAB  1   IAB      Ectopic      Multiple  1   Live Births  2            Home Medications    Prior to Admission medications   Medication Sig Start Date End Date Taking? Authorizing Provider  azithromycin (ZITHROMAX Z-PAK) 250 MG tablet 2 pills today then 1 pill a day for 4 days 02/07/23   Faythe Ghee, PA-C  magic mouthwash w/lidocaine SOLN Take 5 mLs by mouth 4 (four) times daily as needed for mouth pain. Swish and spit, do not swallow the solution. 07/09/23   Loleta Rose, MD  magic mouthwash w/lidocaine SOLN Take 5 mLs by mouth 4 (four) times daily as needed for mouth pain. Swish and spit, do not swallow the solution. 07/10/23   Loleta Rose, MD  promethazine-dextromethorphan (PROMETHAZINE-DM) 6.25-15 MG/5ML syrup Take 5 mLs by mouth 4 (four) times daily as needed. 07/28/22   Shirlee Latch, PA-C    Family History Family History  Problem Relation Age of Onset   Diabetes Maternal Uncle  Diabetes Maternal Grandmother    Diabetes Maternal Grandfather     Social History Social History   Tobacco Use   Smoking status: Former    Current packs/day: 0.00    Types: Cigarettes    Quit date: 11/29/2020    Years since quitting: 2.9   Smokeless tobacco: Never  Vaping Use   Vaping status: Never Used  Substance Use Topics   Alcohol use: Not Currently   Drug use: Yes    Types: Marijuana    Comment: 11/29/2020     Allergies   Latex   Review of Systems Review of Systems  Musculoskeletal:  Positive for arthralgias. Negative for joint swelling.  Neurological:  Negative for weakness and numbness.     Physical Exam Triage Vital Signs ED Triage Vitals  Encounter Vitals Group     BP      Systolic BP Percentile      Diastolic BP  Percentile      Pulse      Resp      Temp      Temp src      SpO2      Weight      Height      Head Circumference      Peak Flow      Pain Score      Pain Loc      Pain Education      Exclude from Growth Chart    No data found.  Updated Vital Signs BP 128/82 (BP Location: Right Arm)   Pulse 74   Temp 98.6 F (37 C) (Oral)   Resp 14   Ht 5\' 6"  (1.676 m)   Wt 148 lb (67.1 kg)   LMP 10/14/2023 (Approximate)   SpO2 98%   BMI 23.89 kg/m   Visual Acuity Right Eye Distance:   Left Eye Distance:   Bilateral Distance:    Right Eye Near:   Left Eye Near:    Bilateral Near:     Physical Exam Vitals and nursing note reviewed.  Constitutional:      Appearance: Normal appearance. She is not ill-appearing.  HENT:     Head: Normocephalic and atraumatic.  Musculoskeletal:        General: Tenderness present. No swelling or signs of injury.  Skin:    General: Skin is warm and dry.     Capillary Refill: Capillary refill takes less than 2 seconds.  Neurological:     General: No focal deficit present.     Mental Status: She is alert and oriented to person, place, and time.      UC Treatments / Results  Labs (all labs ordered are listed, but only abnormal results are displayed) Labs Reviewed - No data to display  EKG   Radiology No results found.  Procedures Procedures (including critical care time)  Medications Ordered in UC Medications - No data to display  Initial Impression / Assessment and Plan / UC Course  I have reviewed the triage vital signs and the nursing notes.  Pertinent labs & imaging results that were available during my care of the patient were reviewed by me and considered in my medical decision making (see chart for details).   Patient is a pleasant, nontoxic-appearing 28 year old female presenting for evaluation of left shoulder pain as outlined HPI above.  In the exam room the patient shoulder is in normal anatomical alignment and she is  indicating the pain is at the insertion of the deltoid at the  anterior portion of the glenohumeral joint.  On exam patient has tenderness over the deltoid as well as the anterior aspect of the glenohumeral joint, and superior trapezius.  Resisted flexion, extension, and abduction of the shoulder are normal though the pain does increase with the above activities.  Patient also has a positive empty soup can test on the left which is concerning for possible rotator cuff injury or inflammation.  The patient has been previously assessed by [facility] for left shoulder pain and injury.  I have advised her that she needs to follow-up with [facility] as she needs an MRI of her shoulder to evaluate for possible rotator cuff injury or tear.  She will go to [facility] here in [place] and access them through their walk-in urgent care.  She left ambulatory and in stable condition.   Final Clinical Impressions(s) / UC Diagnoses   Final diagnoses:  Chronic left shoulder pain     Discharge Instructions      Please go to [facility] here in [place] for further evaluation of your left shoulder pain as there is concern for possible rotator cuff injury.     ED Prescriptions   None    PDMP not reviewed this encounter.   Becky Augusta, NP 11/04/23 [phone]

## 2023-11-04 NOTE — ED Triage Notes (Signed)
Patient reports history of problems with her left shoulder.  Patient states that this week she had been doing push ups and reports pain off and on in her left shoulder.

## 2023-11-04 NOTE — Discharge Instructions (Addendum)
Please go to [facility] here in [place] for further evaluation of your left shoulder pain as there is concern for possible rotator cuff injury.
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
## 2024-04-09 ENCOUNTER — Other Ambulatory Visit (HOSPITAL_COMMUNITY)
Admission: RE | Admit: 2024-04-09 | Discharge: 2024-04-09 | Disposition: A | Discharge status: Home / Self Care | Payer: MEDICAID | Source: Ambulatory Visit | Attending: Certified Nurse Midwife | Admitting: Certified Nurse Midwife

## 2024-04-09 ENCOUNTER — Ambulatory Visit (INDEPENDENT_AMBULATORY_CARE_PROVIDER_SITE_OTHER): Payer: MEDICAID | Admitting: Certified Nurse Midwife

## 2024-04-09 ENCOUNTER — Encounter: Payer: Self-pay | Admitting: Certified Nurse Midwife

## 2024-04-09 VITALS — BP 130/80 | HR 86 | Resp 16 | Ht 66.5 in | Wt 143.0 lb

## 2024-04-09 DIAGNOSIS — Z131 Encounter for screening for diabetes mellitus: Secondary | ICD-10-CM

## 2024-04-09 DIAGNOSIS — Z91419 Personal history of unspecified adult abuse: Secondary | ICD-10-CM | POA: Diagnosis not present

## 2024-04-09 DIAGNOSIS — Z3202 Encounter for pregnancy test, result negative: Secondary | ICD-10-CM | POA: Diagnosis not present

## 2024-04-09 DIAGNOSIS — Z01419 Encounter for gynecological examination (general) (routine) without abnormal findings: Secondary | ICD-10-CM | POA: Diagnosis present

## 2024-04-09 DIAGNOSIS — Z124 Encounter for screening for malignant neoplasm of cervix: Secondary | ICD-10-CM | POA: Diagnosis present

## 2024-04-09 DIAGNOSIS — Z1272 Encounter for screening for malignant neoplasm of vagina: Secondary | ICD-10-CM

## 2024-04-09 DIAGNOSIS — Z30013 Encounter for initial prescription of injectable contraceptive: Secondary | ICD-10-CM

## 2024-04-09 DIAGNOSIS — Z87898 Personal history of other specified conditions: Secondary | ICD-10-CM | POA: Insufficient documentation

## 2024-04-09 DIAGNOSIS — Z113 Encounter for screening for infections with a predominantly sexual mode of transmission: Secondary | ICD-10-CM

## 2024-04-09 LAB — POCT URINE PREGNANCY: Preg Test, Ur: NEGATIVE

## 2024-04-09 MED ORDER — MEDROXYPROGESTERONE ACETATE 150 MG/ML IM SUSP
150.0000 mg | Freq: Once | INTRAMUSCULAR | Status: AC
Start: 2024-04-09 — End: 2024-04-09
  Administered 2024-04-09: 150 mg via INTRAMUSCULAR

## 2024-04-09 NOTE — Addendum Note (Signed)
 Addended by: KIZZIE CAMELIA CROME on: 04/09/2024 04:47 PM   Modules accepted: Orders
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
## 2024-04-09 NOTE — Progress Notes (Signed)
 Date last pap: 04/09/2024. Last Depo-Provera: 04/09/2024. Side Effects if any: N/A. Serum HCG indicated? Negative. Depo-Provera 150 mg IM given by: Camelia Fetters, CMA. Next appointment due Sept 15 - Sept 29.

## 2024-04-09 NOTE — Patient Instructions (Signed)
 Etonogestrel Implant What is this medication? ETONOGESTREL (et oh noe JES trel) prevents ovulation and pregnancy. It belongs to a group of medications called contraceptives. This medication is a progestin hormone. This medicine may be used for other purposes; ask your health care provider or pharmacist if you have questions. COMMON BRAND NAME(S): Implanon, Nexplanon What should I tell my care team before I take this medication? They need to know if you have any of these conditions: Abnormal vaginal bleeding Blood clots Blood vessel disease Breast, cervical, endometrial, ovarian, liver, or uterine cancer Diabetes Gallbladder disease Heart disease or recent heart attack High blood pressure High cholesterol or triglycerides Kidney disease Liver disease Migraine headaches Seizures Stroke Tobacco use An unusual or allergic reaction to etonogestrel, other medications, foods, dyes, or preservatives Pregnant or trying to get pregnant Breastfeeding How should I use this medication? This device is inserted just under the skin on the inner side of your upper arm by your care team. Talk to your care team about the use of this medication in children. Special care may be needed. Overdosage: If you think you have taken too much of this medicine contact a poison control center or emergency room at once. NOTE: This medicine is only for you. Do not share this medicine with others. What if I miss a dose? This does not apply. What may interact with this medication? Do not take this medication with any of the following: Amprenavir Fosamprenavir This medication may also interact with the following: Acitretin Aprepitant Armodafinil Bexarotene Bosentan Carbamazepine Certain antivirals for HIV or hepatitis Certain medications for fungal infections, such as fluconazole, ketoconazole, itraconazole, or  voriconazole Cyclosporine Felbamate Griseofulvin Lamotrigine Modafinil Oxcarbazepine Phenobarbital Phenytoin Primidone Rifabutin Rifampin Rifapentine St. John's wort Topiramate This list may not describe all possible interactions. Give your health care provider a list of all the medicines, herbs, non-prescription drugs, or dietary supplements you use. Also tell them if you smoke, drink alcohol, or use illegal drugs. Some items may interact with your medicine. What should I watch for while using this medication? Visit your care team for regular checks on your progress. Using this medication does not protect you or your partner against HIV or other sexually transmitted infections (STIs). You should be able to feel the implant by pressing your fingertips over the skin where it was inserted. Contact your care team if you cannot feel the implant, and use a non-hormonal birth control method (such as condoms) until your care team confirms that the implant is in place. Contact your care team if you think that the implant may have broken or become bent while in your arm. You will receive a user card from your care team after the implant is inserted. The card is a record of the location of the implant in your upper arm and when it should be removed. Keep this card with your health records. What side effects may I notice from receiving this medication? Side effects that you should report to your care team as soon as possible: Allergic reactions--skin rash, itching, hives, swelling of the face, lips, tongue, or throat Blood clot--pain, swelling, or warmth in the leg, shortness of breath, chest pain Gallbladder problems--severe stomach pain, nausea, vomiting, fever Increase in blood pressure Liver injury--right upper belly pain, loss of appetite, nausea, light-colored stool, dark yellow or brown urine, yellowing skin or eyes, unusual weakness or fatigue New or worsening migraines or headaches Pain,  redness, or irritation at injection site Stroke--sudden numbness or weakness of the face, arm,  or leg, trouble speaking, confusion, trouble walking, loss of balance or coordination, dizziness, severe headache, change in vision Unusual vaginal discharge, itching, or odor Worsening mood, feelings of depression Side effects that usually do not require medical attention (report to your care team if they continue or are bothersome): Breast pain or tenderness Dark patches of skin on the face or other sun-exposed areas Irregular menstrual cycles or spotting Nausea Weight gain This list may not describe all possible side effects. Call your doctor for medical advice about side effects. You may report side effects to FDA at 1-800-FDA-1088. Where should I keep my medication? This medication is given in a hospital or clinic and will not be stored at home. NOTE: This sheet is a summary. It may not cover all possible information. If you have questions about this medicine, talk to your doctor, pharmacist, or health care provider.  2024 Elsevier/Gold Standard (2022-05-04 00:00:00)Medroxyprogesterone Injection (Contraception) What is this medication? MEDROXYPROGESTERONE (me DROX ee proe JES te rone) prevents ovulation and pregnancy. It belongs to a group of medications called contraceptives. This medication is a progestin hormone. This medicine may be used for other purposes; ask your health care provider or pharmacist if you have questions. COMMON BRAND NAME(S): Depo-Provera, Depo-subQ Provera 104 What should I tell my care team before I take this medication? They need to know if you have any of these conditions: Asthma Blood clots Breast cancer or family history of breast cancer Depression Diabetes Eating disorder (anorexia nervosa) Frequently drink alcohol Heart attack High blood pressure HIV infection or AIDS Kidney disease Liver disease Migraine headaches Osteoporosis, weak  bones Seizures Stroke Tobacco use Vaginal bleeding An unusual or allergic reaction to medroxyprogesterone, other medications, foods, dyes, or preservatives Pregnant or trying to get pregnant Breast-feeding How should I use this medication? Depo-Provera CI contraceptive injection is given into a muscle. Depo-subQ Provera 104 injection is given under the skin. It is given in a hospital or clinic setting. The injection is usually given during the first 5 days after the start of a menstrual period or 6 weeks after delivery of a baby. A patient package insert for the product will be given with each prescription and refill. Be sure to read this information carefully each time. The sheet may change often. Talk to your care team about the use of this medication in children. Special care may be needed. These injections have been used in female children who have started having menstrual periods. Overdosage: If you think you have taken too much of this medicine contact a poison control center or emergency room at once. NOTE: This medicine is only for you. Do not share this medicine with others. What if I miss a dose? Keep appointments for follow-up doses. You must get an injection once every 3 months. It is important not to miss your dose. Call your care team if you are unable to keep an appointment. What may interact with this medication? Antibiotics or medications for infections, especially rifampin and griseofulvin Antivirals for HIV or hepatitis Aprepitant Armodafinil Bexarotene Bosentan Medications for seizures, such as carbamazepine, felbamate, oxcarbazepine, phenytoin, phenobarbital, primidone, topiramate Mitotane Modafinil St. John's Wort This list may not describe all possible interactions. Give your health care provider a list of all the medicines, herbs, non-prescription drugs, or dietary supplements you use. Also tell them if you smoke, drink alcohol, or use illegal drugs. Some items may  interact with your medicine. What should I watch for while using this medication? This medication does not protect  you against HIV infection (AIDS) or other sexually transmitted diseases. Use of this product may cause you to lose calcium from your bones. Loss of calcium may cause weak bones (osteoporosis). Only use this product for more than 2 years if other forms of birth control are not right for you. The longer you use this product for birth control the more likely you will be at risk for weak bones. Ask your care team how you can keep strong bones. You may have a change in bleeding pattern or irregular periods. Many females stop having periods while taking this medication. If you have received your injections on time, your chance of being pregnant is very low. If you think you may be pregnant, see your care team as soon as possible. Tell your care team if you want to get pregnant within the next year. The effect of this medication may last a long time after you get your last injection. What side effects may I notice from receiving this medication? Side effects that you should report to your care team as soon as possible: Allergic reactions--skin rash, itching, hives, swelling of the face, lips, tongue, or throat Blood clot--pain, swelling, or warmth in the leg, shortness of breath, chest pain Gallbladder problems--severe stomach pain, nausea, vomiting, fever Increase in blood pressure Liver injury--right upper belly pain, loss of appetite, nausea, light-colored stool, dark yellow or brown urine, yellowing skin or eyes, unusual weakness or fatigue New or worsening migraines or headaches Seizures Stroke--sudden numbness or weakness of the face, arm, or leg, trouble speaking, confusion, trouble walking, loss of balance or coordination, dizziness, severe headache, change in vision Unusual vaginal discharge, itching, or odor Worsening mood, feelings of depression Side effects that usually do not  require medical attention (report to your care team if they continue or are bothersome): Breast pain or tenderness Dark patches of the skin on the face or other sun-exposed areas Irregular menstrual cycles or spotting Nausea Weight gain This list may not describe all possible side effects. Call your doctor for medical advice about side effects. You may report side effects to FDA at 1-800-FDA-1088. Where should I keep my medication? This injection is only given by a care team. It will not be stored at home. NOTE: This sheet is a summary. It may not cover all possible information. If you have questions about this medicine, talk to your doctor, pharmacist, or health care provider.  2024 Elsevier/Gold Standard (2021-06-10 00:00:00)

## 2024-04-10 ENCOUNTER — Ambulatory Visit: Payer: Self-pay | Admitting: Certified Nurse Midwife

## 2024-04-10 LAB — COMPREHENSIVE METABOLIC PANEL WITH GFR
ALT: 14 IU/L (ref 0–32)
AST: 15 IU/L (ref 0–40)
Albumin: 4.6 g/dL (ref 4.0–5.0)
Alkaline Phosphatase: 48 IU/L (ref 44–121)
BUN/Creatinine Ratio: 18 (ref 9–23)
BUN: 14 mg/dL (ref 6–20)
Bilirubin Total: 0.5 mg/dL (ref 0.0–1.2)
CO2: 20 mmol/L (ref 20–29)
Calcium: 9.5 mg/dL (ref 8.7–10.2)
Chloride: 101 mmol/L (ref 96–106)
Creatinine, Ser: 0.8 mg/dL (ref 0.57–1.00)
Globulin, Total: 2.6 g/dL (ref 1.5–4.5)
Glucose: 80 mg/dL (ref 70–99)
Potassium: 3.9 mmol/L (ref 3.5–5.2)
Sodium: 139 mmol/L (ref 134–144)
Total Protein: 7.2 g/dL (ref 6.0–8.5)
eGFR: 103 mL/min/{1.73_m2} (ref >59)

## 2024-04-10 LAB — CBC
Hematocrit: 39 % (ref 34.0–46.6)
Hemoglobin: 12.7 g/dL (ref 11.1–15.9)
MCH: 31.2 pg (ref 26.6–33.0)
MCHC: 32.6 g/dL (ref 31.5–35.7)
MCV: 96 fL (ref 79–97)
Platelets: 290 10*3/uL (ref 150–450)
RBC: 4.07 x10E6/uL (ref 3.77–5.28)
RDW: 12.3 % (ref 11.7–15.4)
WBC: 6.5 10*3/uL (ref 3.4–10.8)

## 2024-04-10 LAB — RPR: RPR Ser Ql: NONREACTIVE

## 2024-04-10 LAB — HEMOGLOBIN A1C
Est. average glucose Bld gHb Est-mCnc: 94 mg/dL
Hgb A1c MFr Bld: 4.9 % (ref 4.8–5.6)

## 2024-04-10 LAB — HEPATITIS C ANTIBODY: Hep C Virus Ab: NONREACTIVE

## 2024-04-10 LAB — HEPATITIS B SURFACE ANTIGEN: Hepatitis B Surface Ag: NEGATIVE

## 2024-04-10 LAB — HIV ANTIBODY (ROUTINE TESTING W REFLEX): HIV Screen 4th Generation wRfx: NONREACTIVE

## 2024-04-12 LAB — CYTOLOGY - PAP
Chlamydia: NEGATIVE
Comment: NEGATIVE
Comment: NEGATIVE
Comment: NORMAL
Neisseria Gonorrhea: NEGATIVE
Trichomonas: NEGATIVE

## 2024-05-09 ENCOUNTER — Other Ambulatory Visit: Payer: Self-pay | Admitting: Medical Genetics

## 2024-05-10 ENCOUNTER — Other Ambulatory Visit: Payer: MEDICAID

## 2024-05-30 ENCOUNTER — Encounter: Payer: Self-pay | Admitting: Emergency Medicine

## 2024-05-30 ENCOUNTER — Emergency Department
Admission: EM | Admit: 2024-05-30 | Discharge: 2024-05-30 | Disposition: A | Discharge status: Home / Self Care | Payer: MEDICAID | Attending: Emergency Medicine | Admitting: Emergency Medicine

## 2024-05-30 ENCOUNTER — Other Ambulatory Visit: Payer: Self-pay

## 2024-05-30 DIAGNOSIS — R233 Spontaneous ecchymoses: Secondary | ICD-10-CM | POA: Diagnosis present

## 2024-05-30 DIAGNOSIS — T148XXA Other injury of unspecified body region, initial encounter: Secondary | ICD-10-CM

## 2024-05-30 LAB — CBC WITH DIFFERENTIAL/PLATELET
Abs Immature Granulocytes: 0.02 K/uL (ref 0.00–0.07)
Basophils Absolute: 0 K/uL (ref 0.0–0.1)
Basophils Relative: 0 %
Eosinophils Absolute: 0.1 K/uL (ref 0.0–0.5)
Eosinophils Relative: 2 %
HCT: 36.2 % (ref 36.0–46.0)
Hemoglobin: 12.5 g/dL (ref 12.0–15.0)
Immature Granulocytes: 0 %
Lymphocytes Relative: 39 %
Lymphs Abs: 2.3 K/uL (ref 0.7–4.0)
MCH: 31.5 pg (ref 26.0–34.0)
MCHC: 34.5 g/dL (ref 30.0–36.0)
MCV: 91.2 fL (ref 80.0–100.0)
Monocytes Absolute: 0.5 K/uL (ref 0.1–1.0)
Monocytes Relative: 8 %
Neutro Abs: 3.1 K/uL (ref 1.7–7.7)
Neutrophils Relative %: 51 %
Platelets: 308 K/uL (ref 150–400)
RBC: 3.97 MIL/uL (ref 3.87–5.11)
RDW: 12.5 % (ref 11.5–15.5)
WBC: 6 K/uL (ref 4.0–10.5)
nRBC: 0 % (ref 0.0–0.2)

## 2024-05-30 NOTE — Discharge Instructions (Signed)
 Your blood test is normal.  Please follow-up with your outpatient provider.  Please return for any new, worsening, or change in symptoms or other concerns.  It was a pleasure caring for you today.

## 2024-05-30 NOTE — ED Triage Notes (Signed)
 Patient to ED via POV for bruise to left shoulder. Pt reports she first noticed it this AM- no known injury. States she also had one on left at the end of July that has now gone away. NAD noted.
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
## 2024-07-27 ENCOUNTER — Other Ambulatory Visit: Payer: Self-pay | Admitting: Medical Genetics

## 2024-07-27 DIAGNOSIS — Z006 Encounter for examination for normal comparison and control in clinical research program: Secondary | ICD-10-CM

## 2024-09-12 ENCOUNTER — Ambulatory Visit
Admission: EM | Admit: 2024-09-12 | Discharge: 2024-09-12 | Disposition: A | Discharge status: Home / Self Care | Payer: MEDICAID | Attending: Emergency Medicine | Admitting: Emergency Medicine

## 2024-09-12 DIAGNOSIS — R300 Dysuria: Secondary | ICD-10-CM | POA: Insufficient documentation

## 2024-09-12 DIAGNOSIS — Z113 Encounter for screening for infections with a predominantly sexual mode of transmission: Secondary | ICD-10-CM | POA: Diagnosis not present

## 2024-09-12 DIAGNOSIS — N898 Other specified noninflammatory disorders of vagina: Secondary | ICD-10-CM | POA: Insufficient documentation

## 2024-09-12 LAB — POCT URINE DIPSTICK
Bilirubin, UA: NEGATIVE
Glucose, UA: NEGATIVE mg/dL
Ketones, POC UA: NEGATIVE mg/dL
Nitrite, UA: NEGATIVE
POC PROTEIN,UA: 100 — AB
Spec Grav, UA: >=1.03 — AB (ref 1.010–1.025)
Urobilinogen, UA: 1 U/dL
pH, UA: 5.5 (ref 5.0–8.0)

## 2024-09-12 LAB — POCT URINE PREGNANCY: Preg Test, Ur: NEGATIVE

## 2024-09-12 NOTE — Discharge Instructions (Signed)
 The urine culture is pending.  We will call you if it shows the need for treatment.    Your vaginal  tests are pending.  Your test results will be available on your MyChart account.  If your test results are positive, we will call you.  You and your sexual partner(s) may require treatment at that time.  Do not have sexual activity until all test results are back and treatment is completed if needed.  Follow-up with your primary care provider if your symptoms are not improving.
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
## 2024-09-12 NOTE — ED Triage Notes (Signed)
 Patient to Urgent Care with complaints of vaginal itching and green colored vaginal discharge.  Reports symptoms x1 week. Used honey-pot pads and believes these are the cause of her symptoms.   Also requests STD testing.

## 2024-09-13 ENCOUNTER — Ambulatory Visit (HOSPITAL_COMMUNITY): Payer: Self-pay

## 2024-09-13 LAB — CERVICOVAGINAL ANCILLARY ONLY
Bacterial Vaginitis (gardnerella): POSITIVE — AB
Candida Glabrata: NEGATIVE
Candida Vaginitis: NEGATIVE
Chlamydia: NEGATIVE
Comment: NEGATIVE
Comment: NEGATIVE
Comment: NEGATIVE
Comment: NEGATIVE
Comment: NEGATIVE
Comment: NORMAL
Neisseria Gonorrhea: NEGATIVE
Trichomonas: POSITIVE — AB

## 2024-09-13 LAB — URINE CULTURE: Culture: NO GROWTH

## 2024-09-14 MED ORDER — METRONIDAZOLE 500 MG PO TABS: 500.0000 mg | ORAL_TABLET | Freq: Two times a day (BID) | ORAL | 0 refills | Status: AC

## 2024-09-25 NOTE — Patient Instructions (Signed)
 Nexplanon Instructions After Insertion  Keep bandage clean and dry for 24 hours  May use ice/Tylenol/Ibuprofen for soreness or pain  If you develop fever, drainage or increased warmth from incision site-contact office immediately  Etonogestrel Implant What is this medication? ETONOGESTREL (et oh noe JES trel) prevents ovulation and pregnancy. It belongs to a group of medications called contraceptives. This medication is a progestin hormone. This medicine may be used for other purposes; ask your health care provider or pharmacist if you have questions. COMMON BRAND NAME(S): Implanon, Nexplanon What should I tell my care team before I take this medication? They need to know if you have any of these conditions: Abnormal vaginal bleeding Blood clots Blood vessel disease Breast, cervical, endometrial, ovarian, liver, or uterine cancer Diabetes Gallbladder disease Heart disease or recent heart attack High blood pressure High cholesterol or triglycerides Kidney disease Liver disease Migraine headaches Seizures Stroke Tobacco use An unusual or allergic reaction to etonogestrel, other medications, foods, dyes, or preservatives Pregnant or trying to get pregnant Breastfeeding How should I use this medication? This device is inserted just under the skin on the inner side of your upper arm by your care team. Talk to your care team about the use of this medication in children. Special care may be needed. Overdosage: If you think you have taken too much of this medicine contact a poison control center or emergency room at once. NOTE: This medicine is only for you. Do not share this medicine with others. What if I miss a dose? This does not apply. What may interact with this medication? Do not take this medication with any of the following: Amprenavir Fosamprenavir This medication may also interact with the  following: Acitretin Aprepitant Armodafinil Bexarotene Bosentan Carbamazepine Certain antivirals for HIV or hepatitis Certain medications for fungal infections, such as fluconazole, ketoconazole, itraconazole, or voriconazole Cyclosporine Felbamate Griseofulvin Lamotrigine Modafinil Oxcarbazepine Phenobarbital Phenytoin Primidone Rifabutin Rifampin Rifapentine St. John's wort Topiramate This list may not describe all possible interactions. Give your health care provider a list of all the medicines, herbs, non-prescription drugs, or dietary supplements you use. Also tell them if you smoke, drink alcohol, or use illegal drugs. Some items may interact with your medicine. What should I watch for while using this medication? Visit your care team for regular checks on your progress. Using this medication does not protect you or your partner against HIV or other sexually transmitted infections (STIs). You should be able to feel the implant by pressing your fingertips over the skin where it was inserted. Contact your care team if you cannot feel the implant, and use a non-hormonal birth control method (such as condoms) until your care team confirms that the implant is in place. Contact your care team if you think that the implant may have broken or become bent while in your arm. You will receive a user card from your care team after the implant is inserted. The card is a record of the location of the implant in your upper arm and when it should be removed. Keep this card with your health records. What side effects may I notice from receiving this medication? Side effects that you should report to your care team as soon as possible: Allergic reactions--skin rash, itching, hives, swelling of the face, lips, tongue, or throat Blood clot--pain, swelling, or warmth in the leg, shortness of breath, chest pain Gallbladder problems--severe stomach pain, nausea, vomiting, fever Increase in blood  pressure Liver injury--right upper belly pain, loss of appetite, nausea,  light-colored stool, dark yellow or brown urine, yellowing skin or eyes, unusual weakness or fatigue New or worsening migraines or headaches Pain, redness, or irritation at injection site Stroke--sudden numbness or weakness of the face, arm, or leg, trouble speaking, confusion, trouble walking, loss of balance or coordination, dizziness, severe headache, change in vision Unusual vaginal discharge, itching, or odor Worsening mood, feelings of depression Side effects that usually do not require medical attention (report to your care team if they continue or are bothersome): Breast pain or tenderness Dark patches of skin on the face or other sun-exposed areas Irregular menstrual cycles or spotting Nausea Weight gain This list may not describe all possible side effects. Call your doctor for medical advice about side effects. You may report side effects to FDA at 1-800-FDA-1088. Where should I keep my medication? This medication is given in a hospital or clinic and will not be stored at home. NOTE: This sheet is a summary. It may not cover all possible information. If you have questions about this medicine, talk to your doctor, pharmacist, or health care provider.  2024 [employer]vier/Gold Standard (2022-05-04 00:00:00)

## 2024-09-26 ENCOUNTER — Ambulatory Visit: Payer: MEDICAID | Admitting: Certified Nurse Midwife

## 2024-09-26 ENCOUNTER — Encounter: Payer: Self-pay | Admitting: Certified Nurse Midwife

## 2024-09-26 VITALS — BP 112/68 | HR 81 | Resp 16 | Ht 66.0 in | Wt 134.9 lb

## 2024-09-26 DIAGNOSIS — Z3202 Encounter for pregnancy test, result negative: Secondary | ICD-10-CM | POA: Diagnosis not present

## 2024-09-26 DIAGNOSIS — Z30017 Encounter for initial prescription of implantable subdermal contraceptive: Secondary | ICD-10-CM

## 2024-09-26 LAB — POCT URINE PREGNANCY: Preg Test, Ur: NEGATIVE

## 2024-09-26 MED ORDER — ETONOGESTREL 68 MG ~~LOC~~ IMPL
68.0000 mg | DRUG_IMPLANT | Freq: Once | SUBCUTANEOUS | Status: AC
  Administered 2024-09-26: 14:00:00 68 mg via SUBCUTANEOUS

## 2024-09-26 NOTE — Progress Notes (Signed)
° ° ° ° °  GYNECOLOGY OFFICE PROCEDURE NOTE  Kendra Ward is a 28 y.o. [phone] here for Nexplanon  insertion.  Last pap smear was on 04/09/2024 and was normal.  No other gynecologic concerns.  Nexplanon  Insertion Procedure Patient identified, informed consent performed, consent signed.   Patient does understand that irregular bleeding is a very common side effect of this medication. She was advised to have backup contraception for one week after placement. Pregnancy test in clinic today was negative.  Appropriate time out taken.  Patient's left arm was prepped and draped in the usual sterile fashion. The ruler used to measure and mark insertion area.  Patient was prepped with alcohol swab and then injected with 3 ml of 1% lidocaine .  She was prepped with betadine, Nexplanon  removed from packaging,  Device confirmed in needle, then inserted full length of needle and withdrawn per handbook instructions. Nexplanon  was able to palpated in the patient's arm; patient palpated the insert herself. There was minimal blood loss.  Patient insertion site covered with guaze and a pressure bandage to reduce any bruising.  The patient tolerated the procedure well and was given post procedure instructions.     Damien Parsley, CNM [facility]
